# Patient Record
Sex: Male | Born: 1960 | Race: Black or African American | Hispanic: No | Marital: Married | Smoking: Never smoker
Health system: Southern US, Community
[De-identification: ages and names within clinical notes are randomized; demographics above are authoritative.]

## PROBLEM LIST (undated history)

## (undated) DIAGNOSIS — C61 Malignant neoplasm of prostate: Secondary | ICD-10-CM

## (undated) DIAGNOSIS — R569 Unspecified convulsions: Secondary | ICD-10-CM

## (undated) HISTORY — PX: HERNIA REPAIR: SHX51

---

## 2004-01-01 ENCOUNTER — Emergency Department (HOSPITAL_COMMUNITY): Admission: EM | Admit: 2004-01-01 | Discharge: 2004-01-01 | Payer: Self-pay | Admitting: Emergency Medicine

## 2007-06-18 ENCOUNTER — Emergency Department (HOSPITAL_COMMUNITY): Admission: EM | Admit: 2007-06-18 | Discharge: 2007-06-18 | Payer: Self-pay | Admitting: Emergency Medicine

## 2015-06-01 ENCOUNTER — Emergency Department (HOSPITAL_COMMUNITY)
Admission: EM | Admit: 2015-06-01 | Discharge: 2015-06-01 | Disposition: A | Discharge status: Home / Self Care | Payer: Self-pay | Attending: Emergency Medicine | Admitting: Emergency Medicine

## 2015-06-01 ENCOUNTER — Encounter (HOSPITAL_COMMUNITY): Payer: Self-pay | Admitting: Emergency Medicine

## 2015-06-01 DIAGNOSIS — S3994XA Unspecified injury of external genitals, initial encounter: Secondary | ICD-10-CM | POA: Insufficient documentation

## 2015-06-01 DIAGNOSIS — X58XXXA Exposure to other specified factors, initial encounter: Secondary | ICD-10-CM | POA: Insufficient documentation

## 2015-06-01 DIAGNOSIS — Y998 Other external cause status: Secondary | ICD-10-CM | POA: Insufficient documentation

## 2015-06-01 DIAGNOSIS — Y9289 Other specified places as the place of occurrence of the external cause: Secondary | ICD-10-CM | POA: Insufficient documentation

## 2015-06-01 DIAGNOSIS — Y9389 Activity, other specified: Secondary | ICD-10-CM | POA: Insufficient documentation

## 2015-06-01 HISTORY — DX: Unspecified convulsions: R56.9

## 2015-06-01 NOTE — ED Provider Notes (Signed)
CSN: [id number]     Arrival date & time 06/01/15  0537 History   First MD Initiated Contact with Patient 06/01/15 [phone]     Chief Complaint  Patient presents with  . Penis Pain     (Consider location/radiation/quality/duration/timing/severity/associated sxs/prior Treatment) HPI  This is a 54 year old male who was having intercourse with his wife yesterday morning when he felt a pop. His penis has subsequently become swollen. There is little to no associated pain. He has not attempted intercourse since. He denies hematuria or difficulty urinating. There is no testicular involvement.  Past Medical History  Diagnosis Date  . Seizures    Past Surgical History  Procedure Laterality Date  . Hernia repair     No family history on file. Social History  Substance Use Topics  . Smoking status: Never Smoker   . Smokeless tobacco: None  . Alcohol Use: Yes     Comment: social     Review of Systems  All other systems reviewed and are negative.   Allergies  Review of patient's allergies indicates no known allergies.  Home Medications   Prior to Admission medications   Not on File   BP 160/100 mmHg  Pulse 79  Temp(Src) 98.2 F (36.8 C) (Oral)  Resp 18  Wt 190 lb (86.183 kg)  SpO2 97%   Physical Exam  General: Well-developed, well-nourished male in no acute distress; appearance consistent with age of record HENT: normocephalic; atraumatic Eyes: pupils equal, round and reactive to light; extraocular muscles intact; arcus senilis bilaterally Neck: supple Heart: regular rate and rhythm; no murmurs, rubs or gallops Lungs: clear to auscultation bilaterally Abdomen: soft; nondistended; nontender; no masses or hepatosplenomegaly; bowel sounds present GU: Tanner 5 male, circumcised; edema of the shaft of the penis without tenderness; testicles descended bilaterally without mass or tenderness Extremities: No deformity; full range of motion; pulses normal Neurologic: Awake, alert and  oriented; motor function intact in all extremities and symmetric; no facial droop Skin: Warm and dry Psychiatric: Normal mood and affect    ED Course  Procedures (including critical care time)   MDM  6:06 AM Discussed with Dr. Matilde Sprang, who will see patient in the office this morning.   Shanon Rosser, MD 06/01/15 [phone]

## 2015-06-01 NOTE — ED Notes (Addendum)
Pt from home c/o penile pain after having intercourse on Sunday morning around 0800. He reports during the activity he heard a pop, swelling to his penis present, and the "vein" he normally sees is now gone. Pt is concerned and would like to be examined. He reports pain 1/10. He denies urinary symptoms.

## 2015-06-01 NOTE — ED Notes (Signed)
D/c instructions and consult information reviewed with pt. Pt voiced understanding and had no questions. Departed under own power and in NAD.

## 2015-12-07 ENCOUNTER — Encounter (HOSPITAL_COMMUNITY): Payer: Self-pay | Admitting: Emergency Medicine

## 2015-12-07 ENCOUNTER — Emergency Department (HOSPITAL_COMMUNITY)
Admission: EM | Admit: 2015-12-07 | Discharge: 2015-12-07 | Disposition: A | Discharge status: Home / Self Care | Payer: Self-pay | Attending: Emergency Medicine | Admitting: Emergency Medicine

## 2015-12-07 ENCOUNTER — Emergency Department (HOSPITAL_COMMUNITY): Payer: Self-pay

## 2015-12-07 DIAGNOSIS — Y998 Other external cause status: Secondary | ICD-10-CM | POA: Insufficient documentation

## 2015-12-07 DIAGNOSIS — R05 Cough: Secondary | ICD-10-CM | POA: Insufficient documentation

## 2015-12-07 DIAGNOSIS — Y249XXA Unspecified firearm discharge, undetermined intent, initial encounter: Secondary | ICD-10-CM

## 2015-12-07 DIAGNOSIS — Y92009 Unspecified place in unspecified non-institutional (private) residence as the place of occurrence of the external cause: Secondary | ICD-10-CM | POA: Insufficient documentation

## 2015-12-07 DIAGNOSIS — Z8719 Personal history of other diseases of the digestive system: Secondary | ICD-10-CM | POA: Insufficient documentation

## 2015-12-07 DIAGNOSIS — R0981 Nasal congestion: Secondary | ICD-10-CM | POA: Insufficient documentation

## 2015-12-07 DIAGNOSIS — Z23 Encounter for immunization: Secondary | ICD-10-CM | POA: Insufficient documentation

## 2015-12-07 DIAGNOSIS — S81002A Unspecified open wound, left knee, initial encounter: Secondary | ICD-10-CM | POA: Insufficient documentation

## 2015-12-07 DIAGNOSIS — Z880 Allergy status to penicillin: Secondary | ICD-10-CM | POA: Insufficient documentation

## 2015-12-07 DIAGNOSIS — Y9389 Activity, other specified: Secondary | ICD-10-CM | POA: Insufficient documentation

## 2015-12-07 DIAGNOSIS — W3400XA Accidental discharge from unspecified firearms or gun, initial encounter: Secondary | ICD-10-CM | POA: Insufficient documentation

## 2015-12-07 MED ORDER — TETANUS-DIPHTH-ACELL PERTUSSIS 5-2.5-18.5 LF-MCG/0.5 IM SUSP
0.5000 mL | Freq: Once | INTRAMUSCULAR | Status: AC
  Administered 2015-12-07: 0.5 mL via INTRAMUSCULAR
  Filled 2015-12-07: qty 0.5
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
## 2015-12-07 NOTE — Progress Notes (Signed)
Orthopedic Tech Progress Note Patient Details:  Roberto Watson 01-26-1961 [id number]  Patient ID: Roberto Watson, male   DOB: 02/01/1961, 55 y.o.   MRN: [id number] Pt was later downgraded to a non trauma  Roberto Watson 12/07/2015, 1:54 PM

## 2015-12-08 ENCOUNTER — Encounter (HOSPITAL_COMMUNITY): Payer: Self-pay | Admitting: Emergency Medicine

## 2016-10-03 ENCOUNTER — Encounter: Payer: Self-pay | Admitting: Physician Assistant

## 2016-10-03 ENCOUNTER — Ambulatory Visit (INDEPENDENT_AMBULATORY_CARE_PROVIDER_SITE_OTHER): Payer: BLUE CROSS/BLUE SHIELD | Admitting: Physician Assistant

## 2016-10-03 VITALS — BP 132/88 | HR 72 | Temp 99.0°F | Resp 16 | Ht 69.0 in | Wt 184.2 lb

## 2016-10-03 DIAGNOSIS — R0981 Nasal congestion: Secondary | ICD-10-CM

## 2016-10-03 DIAGNOSIS — R05 Cough: Secondary | ICD-10-CM | POA: Diagnosis not present

## 2016-10-03 DIAGNOSIS — J329 Chronic sinusitis, unspecified: Secondary | ICD-10-CM | POA: Diagnosis not present

## 2016-10-03 DIAGNOSIS — R059 Cough, unspecified: Secondary | ICD-10-CM

## 2016-10-03 MED ORDER — FLUTICASONE PROPIONATE 50 MCG/ACT NA SUSP: 2.0000 | Freq: Every day | NASAL | 6 refills | Status: DC

## 2016-10-03 MED ORDER — MUCINEX DM MAXIMUM STRENGTH 60-1200 MG PO TB12: 1.0000 | ORAL_TABLET | Freq: Two times a day (BID) | ORAL | 1 refills | Status: DC

## 2016-10-03 MED ORDER — DOXYCYCLINE HYCLATE 100 MG PO CAPS: 100.0000 mg | ORAL_CAPSULE | Freq: Two times a day (BID) | ORAL | 0 refills | Status: AC

## 2016-10-03 NOTE — Progress Notes (Signed)
   Roberto Watson  MRN: [id number] DOB: Jun 03, 1961  PCP: No primary care provider on file.  Subjective:  Pt is a 56 year old male who presents to clinic for nasal congestion x 2-3 months. He gets more stopped up at night, makes it difficult to sleep. Blowing his nose all day. Reports some blood with nose blowing. Coughs some, nothing too bad. Cough is productive.  +headache, sinus pressure.  He has tried claritin D, sudafed - helped some, then came right back. He is not drinking much water at all. Denies fever, chills, body aches, sweats, chest pain, SOB palpitations, headache, ear pain, decreased appetite.  History of seasonal allergies. Does not take anything. No known sick contacts  Review of Systems  Constitutional: Negative for chills, diaphoresis and fever.  HENT: Positive for congestion, postnasal drip, rhinorrhea and sinus pressure. Negative for sinus pain, sneezing and sore throat.   Respiratory: Positive for cough. Negative for chest tightness, shortness of breath and wheezing.   Cardiovascular: Negative for chest pain and palpitations.  Gastrointestinal: Negative for diarrhea, nausea and vomiting.  Neurological: Positive for headaches. Negative for dizziness, syncope and light-headedness.  Psychiatric/Behavioral: Negative for sleep disturbance.    There are no active problems to display for this patient.   No current outpatient prescriptions on file prior to visit.   No current facility-administered medications on file prior to visit.     Allergies  Allergen Reactions  . Penicillins Anaphylaxis  .           Objective:  BP 132/88 (BP Location: Left Arm, Cuff Size: Normal)   Pulse 72   Temp 99 F (37.2 C) (Oral)   Resp 16   Ht 5\' 9"  (1.753 m)   Wt 184 lb 3.2 oz (83.6 kg)   SpO2 99%   BMI 27.20 kg/m   Physical Exam  Constitutional: He is oriented to person, place, and time and well-developed, well-nourished, and in no distress. No distress.  HENT:  Right  Ear: Tympanic membrane normal.  Left Ear: Tympanic membrane normal.  Nose: Mucosal edema and rhinorrhea present. Right sinus exhibits maxillary sinus tenderness. Right sinus exhibits no frontal sinus tenderness. Left sinus exhibits maxillary sinus tenderness. Left sinus exhibits no frontal sinus tenderness.  Mouth/Throat: Mucous membranes are normal. Posterior oropharyngeal edema present. No oropharyngeal exudate or posterior oropharyngeal erythema.  Cardiovascular: Normal rate, regular rhythm and normal heart sounds.   Pulmonary/Chest: Effort normal and breath sounds normal.  Neurological: He is alert and oriented to person, place, and time. GCS score is 15.  Skin: Skin is warm and dry.  Psychiatric: Mood, memory, affect and judgment normal.  Vitals reviewed.   Assessment and Plan :  1. Sinusitis, unspecified location 2. Nasal congestion 3. Cough - doxycycline (VIBRAMYCIN) 100 MG capsule; Take 1 capsule (100 mg total) by mouth 2 (two) times daily.  Dispense: 20 capsule; Refill: 0 - fluticasone (FLONASE) 50 MCG/ACT nasal spray; Place 2 sprays into both nostrils daily.  Dispense: 16 g; Refill: 6 - Dextromethorphan-Guaifenesin (MUCINEX DM MAXIMUM STRENGTH) 60-1200 MG TB12; Take 1 tablet by mouth every 12 (twelve) hours.  Dispense: 14 each; Refill: 1 - Supportive care: Push fluids, rest. RTC in one week if no improvement.    Mercer Pod, PA-C  [facility] 10/03/2016 11:27 AM

## 2016-10-03 NOTE — Patient Instructions (Addendum)
Please drink at least 2 liters of water a day. Mucinex- take this as directed.  Flonase: 2 sprays each nostril before bed at night and in the morning. See package insert on how to spray.  Doxycycline - Use this if you are not better in 5 days.   Come back to the clinic if you are not better after your treatment.   Thank you for coming in today. I hope you feel we met your needs.  Feel free to call [facility] if you have any questions or further requests.  Please consider signing up for MyChart if you do not already have it, as this is a great way to communicate with me.  Best,  Whitney McVey, PA-C  IF you received an x-ray today, you will receive an invoice from [facility]. Please contact [facility] at [phone] with questions or concerns regarding your invoice.   IF you received labwork today, you will receive an invoice from [facility]. Please contact [facility] at [phone] with questions or concerns regarding your invoice.   Our billing staff will not be able to assist you with questions regarding bills from these companies.  You will be contacted with the lab results as soon as they are available. The fastest way to get your results is to activate your My Chart account. Instructions are located on the last page of this paperwork. If you have not heard from [place] regarding the results in 2 weeks, please contact this office.

## 2018-01-26 ENCOUNTER — Emergency Department (HOSPITAL_COMMUNITY): Payer: No Typology Code available for payment source

## 2018-01-26 ENCOUNTER — Encounter (HOSPITAL_COMMUNITY): Payer: Self-pay

## 2018-01-26 ENCOUNTER — Other Ambulatory Visit: Payer: Self-pay

## 2018-01-26 ENCOUNTER — Emergency Department (HOSPITAL_COMMUNITY)
Admission: EM | Admit: 2018-01-26 | Discharge: 2018-01-26 | Disposition: A | Discharge status: Home / Self Care | Payer: No Typology Code available for payment source | Attending: Emergency Medicine | Admitting: Emergency Medicine

## 2018-01-26 DIAGNOSIS — Y9389 Activity, other specified: Secondary | ICD-10-CM | POA: Insufficient documentation

## 2018-01-26 DIAGNOSIS — S2242XA Multiple fractures of ribs, left side, initial encounter for closed fracture: Secondary | ICD-10-CM

## 2018-01-26 DIAGNOSIS — Z79899 Other long term (current) drug therapy: Secondary | ICD-10-CM | POA: Insufficient documentation

## 2018-01-26 DIAGNOSIS — R1012 Left upper quadrant pain: Secondary | ICD-10-CM | POA: Diagnosis not present

## 2018-01-26 DIAGNOSIS — R42 Dizziness and giddiness: Secondary | ICD-10-CM | POA: Diagnosis present

## 2018-01-26 DIAGNOSIS — Y929 Unspecified place or not applicable: Secondary | ICD-10-CM | POA: Diagnosis not present

## 2018-01-26 DIAGNOSIS — Y999 Unspecified external cause status: Secondary | ICD-10-CM | POA: Diagnosis not present

## 2018-01-26 LAB — COMPREHENSIVE METABOLIC PANEL
ALBUMIN: 4.2 g/dL (ref 3.5–5.0)
ALT: 26 U/L (ref 17–63)
ANION GAP: 10 (ref 5–15)
AST: 29 U/L (ref 15–41)
Alkaline Phosphatase: 83 U/L (ref 38–126)
BUN: 12 mg/dL (ref 6–20)
CO2: 26 mmol/L (ref 22–32)
Calcium: 9 mg/dL (ref 8.9–10.3)
Chloride: 102 mmol/L (ref 101–111)
Creatinine, Ser: 1.19 mg/dL (ref 0.61–1.24)
GFR calc Af Amer: >60 mL/min (ref >60)
GFR calc non Af Amer: >60 mL/min (ref >60)
GLUCOSE: 109 mg/dL — AB (ref 65–99)
POTASSIUM: 3.4 mmol/L — AB (ref 3.5–5.1)
SODIUM: 138 mmol/L (ref 135–145)
Total Bilirubin: 1.3 mg/dL — ABNORMAL HIGH (ref 0.3–1.2)
Total Protein: 7.3 g/dL (ref 6.5–8.1)

## 2018-01-26 LAB — CBC WITH DIFFERENTIAL/PLATELET
Abs Immature Granulocytes: 0.1 10*3/uL (ref 0.0–0.1)
BASOS ABS: 0 10*3/uL (ref 0.0–0.1)
Basophils Relative: 0 %
EOS ABS: 0 10*3/uL (ref 0.0–0.7)
Eosinophils Relative: 0 %
HCT: 46.8 % (ref 39.0–52.0)
HEMOGLOBIN: 15.9 g/dL (ref 13.0–17.0)
IMMATURE GRANULOCYTES: 1 %
Lymphocytes Relative: 9 %
Lymphs Abs: 0.9 10*3/uL (ref 0.7–4.0)
MCH: 32.3 pg (ref 26.0–34.0)
MCHC: 34 g/dL (ref 30.0–36.0)
MCV: 95.1 fL (ref 78.0–100.0)
Monocytes Absolute: 0.8 10*3/uL (ref 0.1–1.0)
Monocytes Relative: 8 %
Neutro Abs: 8 10*3/uL — ABNORMAL HIGH (ref 1.7–7.7)
Neutrophils Relative %: 82 %
Platelets: 206 10*3/uL (ref 150–400)
RBC: 4.92 MIL/uL (ref 4.22–5.81)
RDW: 13.2 % (ref 11.5–15.5)
WBC: 9.8 10*3/uL (ref 4.0–10.5)

## 2018-01-26 MED ORDER — HYDROMORPHONE HCL 2 MG/ML IJ SOLN
1.0000 mg | Freq: Once | INTRAMUSCULAR | Status: AC
  Administered 2018-01-26: 1 mg via INTRAVENOUS
  Filled 2018-01-26: qty 1

## 2018-01-26 MED ORDER — IOHEXOL 300 MG/ML  SOLN
100.0000 mL | Freq: Once | INTRAMUSCULAR | Status: AC | PRN
  Administered 2018-01-26: 100 mL via INTRAVENOUS

## 2018-01-26 MED ORDER — SODIUM CHLORIDE 0.9 % IV BOLUS
500.0000 mL | Freq: Once | INTRAVENOUS | Status: AC
  Administered 2018-01-26: 500 mL via INTRAVENOUS

## 2018-01-26 MED ORDER — OXYCODONE-ACETAMINOPHEN 5-325 MG PO TABS: 1.0000 | ORAL_TABLET | ORAL | 0 refills | Status: DC | PRN

## 2018-01-26 MED ORDER — ONDANSETRON HCL 4 MG/2ML IJ SOLN
4.0000 mg | Freq: Once | INTRAMUSCULAR | Status: AC
  Administered 2018-01-26: 4 mg via INTRAVENOUS
  Filled 2018-01-26: qty 2
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
## 2018-01-26 NOTE — ED Notes (Signed)
Patient transported to X-ray 
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
## 2018-01-27 ENCOUNTER — Emergency Department (HOSPITAL_COMMUNITY)
Admission: EM | Admit: 2018-01-27 | Discharge: 2018-01-27 | Disposition: A | Discharge status: Home / Self Care | Payer: No Typology Code available for payment source | Attending: Emergency Medicine | Admitting: Emergency Medicine

## 2018-01-27 ENCOUNTER — Emergency Department (HOSPITAL_COMMUNITY): Payer: No Typology Code available for payment source

## 2018-01-27 ENCOUNTER — Other Ambulatory Visit: Payer: Self-pay

## 2018-01-27 ENCOUNTER — Encounter (HOSPITAL_COMMUNITY): Payer: Self-pay

## 2018-01-27 DIAGNOSIS — Z79899 Other long term (current) drug therapy: Secondary | ICD-10-CM | POA: Diagnosis not present

## 2018-01-27 DIAGNOSIS — R42 Dizziness and giddiness: Secondary | ICD-10-CM

## 2018-01-27 DIAGNOSIS — R112 Nausea with vomiting, unspecified: Secondary | ICD-10-CM | POA: Diagnosis not present

## 2018-01-27 LAB — COMPREHENSIVE METABOLIC PANEL
ALBUMIN: 3.9 g/dL (ref 3.5–5.0)
ALK PHOS: 74 U/L (ref 38–126)
ALT: 24 U/L (ref 17–63)
AST: 23 U/L (ref 15–41)
Anion gap: 11 (ref 5–15)
BUN: 20 mg/dL (ref 6–20)
CALCIUM: 8.5 mg/dL — AB (ref 8.9–10.3)
CO2: 26 mmol/L (ref 22–32)
CREATININE: 1.07 mg/dL (ref 0.61–1.24)
Chloride: 101 mmol/L (ref 101–111)
GFR calc Af Amer: >60 mL/min (ref >60)
GFR calc non Af Amer: >60 mL/min (ref >60)
GLUCOSE: 113 mg/dL — AB (ref 65–99)
Potassium: 3.8 mmol/L (ref 3.5–5.1)
SODIUM: 138 mmol/L (ref 135–145)
Total Bilirubin: 1.4 mg/dL — ABNORMAL HIGH (ref 0.3–1.2)
Total Protein: 7.4 g/dL (ref 6.5–8.1)

## 2018-01-27 LAB — CBC
HCT: 42.2 % (ref 39.0–52.0)
Hemoglobin: 14.6 g/dL (ref 13.0–17.0)
MCH: 33.8 pg (ref 26.0–34.0)
MCHC: 34.6 g/dL (ref 30.0–36.0)
MCV: 97.7 fL (ref 78.0–100.0)
PLATELETS: 180 10*3/uL (ref 150–400)
RBC: 4.32 MIL/uL (ref 4.22–5.81)
RDW: 13.9 % (ref 11.5–15.5)
WBC: 6.9 10*3/uL (ref 4.0–10.5)

## 2018-01-27 LAB — LIPASE, BLOOD: LIPASE: 24 U/L (ref 11–51)

## 2018-01-27 MED ORDER — LORAZEPAM 2 MG/ML IJ SOLN
0.5000 mg | Freq: Once | INTRAMUSCULAR | Status: AC
  Administered 2018-01-27: 0.5 mg via INTRAVENOUS
  Filled 2018-01-27: qty 1

## 2018-01-27 MED ORDER — ONDANSETRON HCL 4 MG/2ML IJ SOLN
4.0000 mg | Freq: Once | INTRAMUSCULAR | Status: AC
  Administered 2018-01-27: 4 mg via INTRAVENOUS
  Filled 2018-01-27: qty 2

## 2018-01-27 MED ORDER — IOPAMIDOL (ISOVUE-370) INJECTION 76%
100.0000 mL | Freq: Once | INTRAVENOUS | Status: AC | PRN
  Administered 2018-01-27: 80 mL via INTRAVENOUS

## 2018-01-27 MED ORDER — IOPAMIDOL (ISOVUE-370) INJECTION 76%
INTRAVENOUS | Status: AC
  Filled 2018-01-27: qty 100

## 2018-01-27 MED ORDER — MECLIZINE HCL 12.5 MG PO TABS: 12.5000 mg | ORAL_TABLET | Freq: Three times a day (TID) | ORAL | 0 refills | Status: DC | PRN

## 2018-01-27 MED ORDER — SODIUM CHLORIDE 0.9 % IV BOLUS
1000.0000 mL | Freq: Once | INTRAVENOUS | Status: AC
  Administered 2018-01-27: 1000 mL via INTRAVENOUS
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
## 2018-01-27 NOTE — Discharge Instructions (Addendum)
It was our pleasure to provide your ER care today - we hope that you feel better.  Sinus congestion noted on imaging studies which may contribute to your symptoms - take claritin-d or zyrtec-d as need for congestion and symptom relief. You may also take antivert as need for symptom relief. No driving if/when dizzy.    A few incidental findings were noted on your imaging studies:  Chronic right maxillary sinusitis with medial wall bowing, possible obstructing polyp. Recommend ENT referral. At the same time, a 2.1 cm thyroid nodule can be worked up with ultrasound. Large polyp in the right maxillary sinus extending into the nasal cavity, suspicious for inverted papilloma.  Atherosclerosis in carotid/basilar vessels.  Scattered small nonspecific hypodensities within the liver measure up to 1.4 cm in size.  For sinus polyp - follow up with ENT doctor in the next few weeks - call office Monday to arrange appointment.  For thyroid nodule, atherosclerosis, and liver densities - follow up with primary care doctor in the next 1-2 weeks - discuss with them and have them arrange an ultrasound and further evaluation. Also have blood pressure rechecked by primary care doctor as it is high today.   Return to ER if worse, new symptoms, new or severe pain, fevers, fainting, other concern.

## 2018-01-27 NOTE — ED Notes (Signed)
Bed: PY19 Expected date:  Expected time:  Means of arrival:  Comments: 57 yo N/V

## 2018-01-27 NOTE — ED Notes (Signed)
ED Provider at bedside. 

## 2018-01-27 NOTE — ED Notes (Signed)
Patient transported to MRI 

## 2019-02-21 ENCOUNTER — Other Ambulatory Visit: Payer: Self-pay

## 2019-02-21 ENCOUNTER — Emergency Department (HOSPITAL_COMMUNITY): Payer: Self-pay

## 2019-02-21 ENCOUNTER — Encounter (HOSPITAL_COMMUNITY): Payer: Self-pay

## 2019-02-21 ENCOUNTER — Emergency Department (HOSPITAL_COMMUNITY)
Admission: EM | Admit: 2019-02-21 | Discharge: 2019-02-21 | Disposition: A | Discharge status: Home / Self Care | Payer: Self-pay | Attending: Emergency Medicine | Admitting: Emergency Medicine

## 2019-02-21 DIAGNOSIS — R569 Unspecified convulsions: Secondary | ICD-10-CM | POA: Insufficient documentation

## 2019-02-21 DIAGNOSIS — Z79899 Other long term (current) drug therapy: Secondary | ICD-10-CM | POA: Insufficient documentation

## 2019-02-21 DIAGNOSIS — R42 Dizziness and giddiness: Secondary | ICD-10-CM | POA: Insufficient documentation

## 2019-02-21 LAB — URINALYSIS, ROUTINE W REFLEX MICROSCOPIC
Bilirubin Urine: NEGATIVE
Glucose, UA: NEGATIVE mg/dL
Hgb urine dipstick: NEGATIVE
Ketones, ur: NEGATIVE mg/dL
Leukocytes,Ua: NEGATIVE
Nitrite: NEGATIVE
Protein, ur: NEGATIVE mg/dL
Specific Gravity, Urine: 1.02 (ref 1.005–1.030)
pH: 5 (ref 5.0–8.0)

## 2019-02-21 LAB — CBG MONITORING, ED: Glucose-Capillary: 83 mg/dL (ref 70–99)

## 2019-02-21 LAB — CBC
HCT: 46.1 % (ref 39.0–52.0)
Hemoglobin: 15 g/dL (ref 13.0–17.0)
MCH: 32.7 pg (ref 26.0–34.0)
MCHC: 32.5 g/dL (ref 30.0–36.0)
MCV: 100.4 fL — ABNORMAL HIGH (ref 80.0–100.0)
Platelets: 184 10*3/uL (ref 150–400)
RBC: 4.59 MIL/uL (ref 4.22–5.81)
RDW: 13.5 % (ref 11.5–15.5)
WBC: 4 10*3/uL (ref 4.0–10.5)
nRBC: 0 % (ref 0.0–0.2)

## 2019-02-21 LAB — BASIC METABOLIC PANEL
Anion gap: 10 (ref 5–15)
BUN: 15 mg/dL (ref 6–20)
CO2: 26 mmol/L (ref 22–32)
Calcium: 8.8 mg/dL — ABNORMAL LOW (ref 8.9–10.3)
Chloride: 102 mmol/L (ref 98–111)
Creatinine, Ser: 1.01 mg/dL (ref 0.61–1.24)
GFR calc Af Amer: >60 mL/min (ref >60)
GFR calc non Af Amer: >60 mL/min (ref >60)
Glucose, Bld: 97 mg/dL (ref 70–99)
Potassium: 3.5 mmol/L (ref 3.5–5.1)
Sodium: 138 mmol/L (ref 135–145)

## 2019-02-21 MED ORDER — MECLIZINE HCL 25 MG PO TABS
25.0000 mg | ORAL_TABLET | Freq: Once | ORAL | Status: AC
Start: 2019-02-21 — End: 2019-02-21
  Administered 2019-02-21: 25 mg via ORAL
  Filled 2019-02-21: qty 1

## 2019-02-21 MED ORDER — SODIUM CHLORIDE 0.9 % IV BOLUS
1000.0000 mL | Freq: Once | INTRAVENOUS | Status: AC
  Administered 2019-02-21: 1000 mL via INTRAVENOUS

## 2019-02-21 MED ORDER — SODIUM CHLORIDE 0.9% FLUSH
3.0000 mL | Freq: Once | INTRAVENOUS | Status: AC
  Administered 2019-02-21: 3 mL via INTRAVENOUS

## 2019-02-21 MED ORDER — MECLIZINE HCL 25 MG PO TABS: 25.0000 mg | ORAL_TABLET | Freq: Three times a day (TID) | ORAL | 0 refills | Status: DC | PRN
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
## 2019-02-21 NOTE — Discharge Instructions (Signed)
Stay hydrated.   Avoid standing up suddenly or turning your head quickly   Take meclizine as needed for dizziness   See your doctor  Return to ER if you have worse dizziness, passing out, chest pain

## 2019-02-21 NOTE — ED Notes (Signed)
Bed: WLPT1 Expected date:  Expected time:  Means of arrival:  Comments: 
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
## 2019-08-26 ENCOUNTER — Other Ambulatory Visit: Payer: Self-pay

## 2019-08-26 ENCOUNTER — Ambulatory Visit: Payer: Self-pay | Admitting: Emergency Medicine

## 2019-08-26 ENCOUNTER — Emergency Department (HOSPITAL_COMMUNITY)
Admission: EM | Admit: 2019-08-26 | Discharge: 2019-08-26 | Disposition: A | Discharge status: Home / Self Care | Payer: BC Managed Care – PPO | Attending: Emergency Medicine | Admitting: Emergency Medicine

## 2019-08-26 ENCOUNTER — Emergency Department (HOSPITAL_COMMUNITY): Payer: BC Managed Care – PPO

## 2019-08-26 ENCOUNTER — Encounter (HOSPITAL_COMMUNITY): Payer: Self-pay | Admitting: Emergency Medicine

## 2019-08-26 DIAGNOSIS — Z79899 Other long term (current) drug therapy: Secondary | ICD-10-CM | POA: Insufficient documentation

## 2019-08-26 DIAGNOSIS — R531 Weakness: Secondary | ICD-10-CM | POA: Insufficient documentation

## 2019-08-26 DIAGNOSIS — R29898 Other symptoms and signs involving the musculoskeletal system: Secondary | ICD-10-CM

## 2019-08-26 LAB — BASIC METABOLIC PANEL
Anion gap: 10 (ref 5–15)
BUN: 21 mg/dL — ABNORMAL HIGH (ref 6–20)
CO2: 27 mmol/L (ref 22–32)
Calcium: 9.2 mg/dL (ref 8.9–10.3)
Chloride: 104 mmol/L (ref 98–111)
Creatinine, Ser: 1.03 mg/dL (ref 0.61–1.24)
GFR calc Af Amer: >60 mL/min (ref >60)
GFR calc non Af Amer: >60 mL/min (ref >60)
Glucose, Bld: 93 mg/dL (ref 70–99)
Potassium: 3.8 mmol/L (ref 3.5–5.1)
Sodium: 141 mmol/L (ref 135–145)

## 2019-08-26 LAB — CBC
HCT: 48.9 % (ref 39.0–52.0)
Hemoglobin: 16.4 g/dL (ref 13.0–17.0)
MCH: 33.5 pg (ref 26.0–34.0)
MCHC: 33.5 g/dL (ref 30.0–36.0)
MCV: 99.8 fL (ref 80.0–100.0)
Platelets: 194 10*3/uL (ref 150–400)
RBC: 4.9 MIL/uL (ref 4.22–5.81)
RDW: 13.4 % (ref 11.5–15.5)
WBC: 4.6 10*3/uL (ref 4.0–10.5)
nRBC: 0 % (ref 0.0–0.2)

## 2019-08-26 LAB — CBG MONITORING, ED: Glucose-Capillary: 79 mg/dL (ref 70–99)

## 2019-08-26 MED ORDER — SODIUM CHLORIDE 0.9% FLUSH: 3.0000 mL | Freq: Once | INTRAVENOUS | Status: DC

## 2019-08-26 NOTE — ED Triage Notes (Signed)
Pt complaint of right leg "giving out" onset 0700 Saturday. Pt also verbalizing "on and off dizziness." Pt denies numbness. Pt extremities strong/equal. A/Ox4. Denies dizziness at present.

## 2019-08-26 NOTE — Discharge Instructions (Addendum)
Return for any problem.  Follow-up with your regular doctor as instructed. °

## 2019-08-26 NOTE — ED Provider Notes (Signed)
[place] DEPT Provider Note   CSN: [id number] Arrival date & time: 08/26/19  1303     History Chief Complaint  Patient presents with  . Weakness    Roberto Watson is a 58 y.o. male.  58 year old male with prior medical history as detailed below presents for evaluation of right leg weakness.  Patient reports that 2 days ago his right leg felt weak.  He felt like his right leg would give out on him.  He was still able to ambulate.  He also reports that the right leg would intermittently " twitch" over the last 2 days as well.  He is currently asymptomatic.  He reports that his symptoms have completely resolved.  He denies associated nausea, vomiting, chest pain, shortness of breath, visual change, other weakness, or other specific complaint.  Overall symptoms lasted approximately 12 to 24 hours.  The history is provided by the patient and medical records.  Weakness Severity:  Mild Onset quality:  Gradual Duration:  2 days Timing:  Rare Progression:  Resolved Chronicity:  New Relieved by:  Nothing Worsened by:  Nothing Associated symptoms: no fever, no headaches, no shortness of breath and no vision change        Past Medical History:  Diagnosis Date  . Seizures ([place])     There are no problems to display for this patient.   Past Surgical History:  Procedure Laterality Date  . HERNIA REPAIR         No family history on file.  Social History   Tobacco Use  . Smoking status: Never Smoker  . Smokeless tobacco: Never Used  Substance Use Topics  . Alcohol use: No    Comment: social   . Drug use: No    Home Medications Prior to Admission medications   Medication Sig Start Date End Date Taking? Authorizing Provider  Dextromethorphan-Guaifenesin (MUCINEX DM MAXIMUM STRENGTH) 60-1200 MG TB12 Take 1 tablet by mouth every 12 (twelve) hours. Patient not taking: Reported on 01/27/2018 10/03/16   McVey, Gelene Mink, PA-C    fluticasone [facility]) 50 MCG/ACT nasal spray Place 2 sprays into both nostrils daily. Patient not taking: Reported on 01/27/2018 10/03/16   McVey, Gelene Mink, PA-C  meclizine (ANTIVERT) 25 MG tablet Take 1 tablet (25 mg total) by mouth 3 (three) times daily as needed for dizziness. 02/21/19   Drenda Freeze, MD  oxyCODONE-acetaminophen (PERCOCET) 5-325 MG tablet Take 1-2 tablets by mouth every 4 (four) hours as needed. Patient not taking: Reported on 02/21/2019 01/26/18   Orpah Greek, MD    Allergies    Penicillins  Review of Systems   Review of Systems  Constitutional: Negative for fever.  Respiratory: Negative for shortness of breath.   Neurological: Positive for weakness. Negative for headaches.  All other systems reviewed and are negative.   Physical Exam Updated Vital Signs BP (!) 165/104 (BP Location: Left Arm)   Pulse 77   Temp 98.3 F (36.8 C) (Oral)   Resp 16   Ht 5\' 7"  (1.702 m)   Wt 81.6 kg   SpO2 98%   BMI 28.19 kg/m   Physical Exam Vitals and nursing note reviewed.  Constitutional:      General: He is not in acute distress.    Appearance: Normal appearance. He is well-developed.  HENT:     Head: Normocephalic and atraumatic.  Eyes:     Conjunctiva/sclera: Conjunctivae normal.     Pupils: Pupils are equal, round, and reactive  to light.  Cardiovascular:     Rate and Rhythm: Normal rate and regular rhythm.     Heart sounds: Normal heart sounds.  Pulmonary:     Effort: Pulmonary effort is normal. No respiratory distress.     Breath sounds: Normal breath sounds.  Abdominal:     General: There is no distension.     Palpations: Abdomen is soft.     Tenderness: There is no abdominal tenderness.  Musculoskeletal:        General: No deformity. Normal range of motion.     Cervical back: Normal range of motion and neck supple.  Skin:    General: Skin is warm and dry.  Neurological:     General: No focal deficit present.     Mental Status:  He is alert and oriented to person, place, and time. Mental status is at baseline.     Cranial Nerves: No cranial nerve deficit.     Sensory: No sensory deficit.     Motor: No weakness.     Coordination: Coordination normal.     Gait: Gait normal.     ED Results / Procedures / Treatments   Labs (all labs ordered are listed, but only abnormal results are displayed) Labs Reviewed  BASIC METABOLIC PANEL - Abnormal; Notable for the following components:      Result Value   BUN 21 (*)    All other components within normal limits  CBC  URINALYSIS, ROUTINE W REFLEX MICROSCOPIC  CBG MONITORING, ED    EKG EKG Interpretation  Date/Time:  Monday August 26 2019 13:16:29 EST Ventricular Rate:  77 PR Interval:    QRS Duration: 83 QT Interval:  351 QTC Calculation: 398 R Axis:   109 Text Interpretation: Sinus rhythm Right axis deviation Borderline repolarization abnormality Baseline wander in lead(s) V2 V3 Confirmed by Dene Gentry [phone]) on 08/26/2019 9:03:14 PM   Radiology CT Head Wo Contrast  Result Date: 08/26/2019 CLINICAL DATA:  Vertigo EXAM: CT HEAD WITHOUT CONTRAST TECHNIQUE: Contiguous axial images were obtained from the base of the skull through the vertex without intravenous contrast. COMPARISON:  02/21/2019 FINDINGS: Brain: Chronic small vessel disease throughout the deep white matter. Old bilateral basal ganglia lacunar infarcts. No acute intracranial abnormality. Specifically, no hemorrhage, hydrocephalus, mass lesion, acute infarction, or significant intracranial injury. Vascular: No hyperdense vessel or unexpected calcification. Skull: No acute calvarial abnormality. Sinuses/Orbits: Expansile process extending from the right maxillary sinus into the right nasal cavity, stable. Other: None IMPRESSION: Chronic small vessel disease. Old bilateral basal ganglia lacunar infarcts. No acute intracranial abnormality. Electronically Signed   By: Rolm Baptise M.D.   On: 08/26/2019  21:25    Procedures Procedures (including critical care time)  Medications Ordered in ED Medications  sodium chloride flush (NS) 0.9 % injection 3 mL (has no administration in time range)    ED Course  I have reviewed the triage vital signs and the nursing notes.  Pertinent labs & imaging results that were available during my care of the patient were reviewed by me and considered in my medical decision making (see chart for details).    MDM Rules/Calculators/A&P                      MDM  Screen complete  SUSAN HOOLE was evaluated in Emergency Department on 08/26/2019 for the symptoms described in the history of present illness. He was evaluated in the context of the global COVID-19 pandemic, which necessitated consideration that  the patient might be at risk for infection with the SARS-CoV-2 virus that causes COVID-19. Institutional protocols and algorithms that pertain to the evaluation of patients at risk for COVID-19 are in a state of rapid change based on information released by regulatory bodies including the CDC and federal and state organizations. These policies and algorithms were followed during the patient's care in the ED.   Patient is presenting for evaluation of right leg weakness.  This was transient.  His symptoms have completely resolved.  Presentation is not consistent with likely TIA or CVA.  However, patient did visit an urgent care prior to his evaluation here.  He is requesting CT of his head (as suggested by urgent care).  Screening work-up in the ED today does not demonstrate significant acute pathology.  Patient appears to be appropriate for discharge.  Importance of close follow-up is stressed.  Strict precautions given and understood.   Final Clinical Impression(s) / ED Diagnoses Final diagnoses:  Transient weakness of lower extremity    Rx / DC Orders ED Discharge Orders    None       Valarie Merino, MD 08/26/19 2149

## 2020-04-17 ENCOUNTER — Encounter (HOSPITAL_COMMUNITY): Payer: Self-pay | Admitting: Emergency Medicine

## 2020-04-17 ENCOUNTER — Emergency Department (HOSPITAL_COMMUNITY)
Admission: EM | Admit: 2020-04-17 | Discharge: 2020-04-17 | Disposition: A | Discharge status: Home / Self Care | Payer: BC Managed Care – PPO | Attending: Emergency Medicine | Admitting: Emergency Medicine

## 2020-04-17 ENCOUNTER — Other Ambulatory Visit: Payer: Self-pay

## 2020-04-17 DIAGNOSIS — M546 Pain in thoracic spine: Secondary | ICD-10-CM | POA: Diagnosis present

## 2020-04-17 MED ORDER — CYCLOBENZAPRINE HCL 10 MG PO TABS: 10.0000 mg | ORAL_TABLET | Freq: Two times a day (BID) | ORAL | 0 refills | Status: DC | PRN

## 2020-04-17 MED ORDER — IBUPROFEN 600 MG PO TABS: 600.0000 mg | ORAL_TABLET | Freq: Four times a day (QID) | ORAL | 0 refills | Status: DC | PRN

## 2020-04-17 NOTE — ED Provider Notes (Signed)
[facility] DEPT Provider Note   CSN: [id number] Arrival date & time: 04/17/20  0134     History Chief Complaint  Patient presents with  . Shoulder Pain    left    Roberto Watson is a 59 y.o. male who presents to the ED today with complaint of gradual onset, constant, achy, left upper back pain x 5-6 days. Pt reports he woke up with the pain; denies any trauma to the area. Denies any falls. Pt has been taking Tylenol without relief.  States that the pain is worse when he is attempting to sleep.  He tried to go to sleep last night and was unable to prompting him to come to the ED today.  Patient denies any numbness, weakness, tingling in his left upper extremity.   The history is provided by the patient and medical records.       Past Medical History:  Diagnosis Date  . Seizures ([place])     There are no problems to display for this patient.   Past Surgical History:  Procedure Laterality Date  . HERNIA REPAIR         No family history on file.  Social History   Tobacco Use  . Smoking status: Never Smoker  . Smokeless tobacco: Never Used  Vaping Use  . Vaping Use: Never used  Substance Use Topics  . Alcohol use: No    Comment: social   . Drug use: No    Home Medications Prior to Admission medications   Medication Sig Start Date End Date Taking? Authorizing Provider  cyclobenzaprine (FLEXERIL) 10 MG tablet Take 1 tablet (10 mg total) by mouth 2 (two) times daily as needed for muscle spasms. 04/17/20   Alroy Bailiff, Mckenzie Bove, PA-C  ibuprofen (ADVIL) 600 MG tablet Take 1 tablet (600 mg total) by mouth every 6 (six) hours as needed. 04/17/20   Eustaquio Maize, PA-C  meclizine (ANTIVERT) 25 MG tablet Take 1 tablet (25 mg total) by mouth 3 (three) times daily as needed for dizziness. 02/21/19   Drenda Freeze, MD    Allergies    Penicillins  Review of Systems   Review of Systems  Constitutional: Negative for chills and fever.    Musculoskeletal: Positive for back pain. Negative for arthralgias.  Neurological: Negative for weakness and numbness.    Physical Exam Updated Vital Signs BP (!) 157/121 (BP Location: Right Arm)   Pulse 94   Temp 98.2 F (36.8 C)   Resp 18   Ht 5\' 9"  (1.753 m)   Wt 83.9 kg   SpO2 99%   BMI 27.32 kg/m   Physical Exam Vitals and nursing note reviewed.  Constitutional:      Appearance: He is not ill-appearing.  HENT:     Head: Normocephalic and atraumatic.  Eyes:     Conjunctiva/sclera: Conjunctivae normal.  Cardiovascular:     Rate and Rhythm: Normal rate and regular rhythm.     Pulses: Normal pulses.  Pulmonary:     Effort: Pulmonary effort is normal.     Breath sounds: Normal breath sounds. No wheezing, rhonchi or rales.  Musculoskeletal:     Comments: No C, T, or L midline spinal TTP. + Left upper parathoracic musculature TTP with spasming. No specific tenderness to the bony aspect of the L shoulder. ROM intact to neck and back. Strength and sensation intact to BUEs. 2+ radial pulses bilaterally.   Skin:    General: Skin is warm and dry.  Coloration: Skin is not jaundiced.  Neurological:     Mental Status: He is alert.     ED Results / Procedures / Treatments   Labs (all labs ordered are listed, but only abnormal results are displayed) Labs Reviewed - No data to display  EKG None  Radiology No results found.  Procedures Procedures (including critical care time)  Medications Ordered in ED Medications - No data to display  ED Course  I have reviewed the triage vital signs and the nursing notes.  Pertinent labs & imaging results that were available during my care of the patient were reviewed by me and considered in my medical decision making (see chart for details).    MDM Rules/Calculators/A&P                          59 year old male who presents to the ED today complaining of atraumatic left upper back pain for the past 5 to 6 days, worsened at  nighttime when he is trying to sleep.  No other complaints at this time.  On arrival to the ED patient is afebrile, nontachycardic nontachypneic.  He appears to be in no acute distress.  He has no midline spinal tenderness.  He does have upper parathoracic musculature tenderness palpation with spasming.  Strength and sensation intact in bilateral upper extremities.  Do not feel patient needs any imaging at this time.  Will provide muscle relaxer and naproxen and have patient follow-up with his PCP.  Patient requesting work note for couple days, will oblige.  Return precautions have been discussed.  Patient is agreement with plan and stable for discharge home.    Final Clinical Impression(s) / ED Diagnoses Final diagnoses:  Acute left-sided thoracic back pain    Rx / DC Orders ED Discharge Orders         Ordered    cyclobenzaprine (FLEXERIL) 10 MG tablet  2 times daily PRN     Discontinue  Reprint     04/17/20 0715    ibuprofen (ADVIL) 600 MG tablet  Every 6 hours PRN     Discontinue  Reprint     04/17/20 0715           Discharge Instructions     Please pick up medication and take as prescribed. DO NOT DRIVE WHILE ON THE MUSCLE RELAXER AS IT CAN MAKE YOU DROWSY. Follow up with your PCP - if you do not have one there is a 1800 number on the last page of this discharge paperwork to help find one who accepts your insurance.   Return to the ED IMMEDIATELY for any worsening symptoms including worsening pain, weakness/numbness/tingling in your upper extremities, fevers > 100.4, or any other new/concerning symptoms       Eustaquio Maize, PA-C 04/17/20 1287    Varney Biles, MD 04/17/20 1045

## 2020-04-17 NOTE — ED Triage Notes (Signed)
Patient states he has been having shoulder pain for a week and while he was trying to sleep it was just unbearable Has been taking tylenol with no relief

## 2020-04-17 NOTE — Discharge Instructions (Signed)
Please pick up medication and take as prescribed. DO NOT DRIVE WHILE ON THE MUSCLE RELAXER AS IT CAN MAKE YOU DROWSY. Follow up with your PCP - if you do not have one there is a 1800 number on the last page of this discharge paperwork to help find one who accepts your insurance.   Return to the ED IMMEDIATELY for any worsening symptoms including worsening pain, weakness/numbness/tingling in your upper extremities, fevers > 100.4, or any other new/concerning symptoms

## 2020-12-05 ENCOUNTER — Encounter (HOSPITAL_COMMUNITY): Payer: Self-pay | Admitting: Emergency Medicine

## 2020-12-05 ENCOUNTER — Emergency Department (HOSPITAL_COMMUNITY)
Admission: EM | Admit: 2020-12-05 | Discharge: 2020-12-05 | Disposition: A | Discharge status: Home / Self Care | Payer: 59 | Attending: Emergency Medicine | Admitting: Emergency Medicine

## 2020-12-05 ENCOUNTER — Other Ambulatory Visit: Payer: Self-pay

## 2020-12-05 DIAGNOSIS — M79602 Pain in left arm: Secondary | ICD-10-CM | POA: Diagnosis not present

## 2020-12-05 MED ORDER — KETOROLAC TROMETHAMINE 30 MG/ML IJ SOLN
30.0000 mg | Freq: Once | INTRAMUSCULAR | Status: AC
  Administered 2020-12-05: 30 mg via INTRAMUSCULAR
  Filled 2020-12-05: qty 1

## 2020-12-05 NOTE — ED Triage Notes (Signed)
Pt reports L arm pain that started on 3/7. His PCP prescribed gabapentin. Pain has not decreased. Denies injury.

## 2020-12-05 NOTE — ED Provider Notes (Signed)
[facility] DEPT Provider Note   CSN: [id number] Arrival date & time: 12/05/20  1945     History Chief Complaint  Patient presents with  . Arm Pain    Roberto Watson is a 60 y.o. male.  HPI 60 year old male presents with left arm pain.  Started about 2-3 weeks ago.  No injuries.  The pain is throughout his entire left arm though primarily in his upper arm.  No chest pain or shortness of breath.  At first the pain has been coming and going but now it is more constant tonight.  Often using his arm makes it worse.  Some pain behind his left shoulder.  No neck pain, weakness, or numbness.  He has been taking gabapentin 100 mg 3 times daily with good relief except for tonight.  Has occasionally taken aspirin but no other meds for pain. Had a shoulder xray at [facility] [place] [facility] a week or 2 ago that was reportedly normal.   Past Medical History:  Diagnosis Date  . Seizures ([place])     There are no problems to display for this patient.   Past Surgical History:  Procedure Laterality Date  . HERNIA REPAIR         History reviewed. No pertinent family history.  Social History   Tobacco Use  . Smoking status: Never Smoker  . Smokeless tobacco: Never Used  Vaping Use  . Vaping Use: Never used  Substance Use Topics  . Alcohol use: No    Comment: social   . Drug use: No    Home Medications Prior to Admission medications   Medication Sig Start Date End Date Taking? Authorizing Provider  cyclobenzaprine (FLEXERIL) 10 MG tablet Take 1 tablet (10 mg total) by mouth 2 (two) times daily as needed for muscle spasms. 04/17/20   Alroy Bailiff, [place], PA-C  ibuprofen (ADVIL) 600 MG tablet Take 1 tablet (600 mg total) by mouth every 6 (six) hours as needed. 04/17/20   Eustaquio Maize, PA-C  meclizine (ANTIVERT) 25 MG tablet Take 1 tablet (25 mg total) by mouth 3 (three) times daily as needed for dizziness. 02/21/19   Drenda Freeze, MD    Allergies     Penicillins  Review of Systems   Review of Systems  Musculoskeletal: Positive for myalgias. Negative for joint swelling.  Neurological: Negative for weakness and numbness.    Physical Exam Updated Vital Signs BP (!) 170/121 (BP Location: Right Arm)   Pulse 92   Temp 98.4 F (36.9 C) (Oral)   Resp 18   SpO2 100%   Physical Exam Vitals and nursing note reviewed.  Constitutional:      General: He is not in acute distress.    Appearance: He is well-developed. He is not ill-appearing or diaphoretic.  HENT:     Head: Normocephalic and atraumatic.     Right Ear: External ear normal.     Left Ear: External ear normal.     Nose: Nose normal.  Eyes:     General:        Right eye: No discharge.        Left eye: No discharge.  Cardiovascular:     Rate and Rhythm: Normal rate and regular rhythm.     Pulses:          Radial pulses are 2+ on the left side.     Heart sounds: Normal heart sounds.  Pulmonary:     Effort: Pulmonary effort is normal.  Breath sounds: Normal breath sounds.  Abdominal:     General: There is no distension.  Musculoskeletal:     Left shoulder: Tenderness (mild, posterior shoulder) present. Normal range of motion.     Left upper arm: No swelling or tenderness.     Left elbow: Normal range of motion. No tenderness.     Left forearm: No swelling, tenderness or bony tenderness.     Cervical back: Neck supple.     Comments: Normal strength/sensation in LUE Normal radial, ulnar, median nerve testing in left hand  Skin:    General: Skin is warm and dry.  Neurological:     Mental Status: He is alert.  Psychiatric:        Mood and Affect: Mood is not anxious.     ED Results / Procedures / Treatments   Labs (all labs ordered are listed, but only abnormal results are displayed) Labs Reviewed - No data to display  EKG None  Radiology No results found.  Procedures Procedures   Medications Ordered in ED Medications  ketorolac (TORADOL) 30  MG/ML injection 30 mg (has no administration in time range)    ED Course  I have reviewed the triage vital signs and the nursing notes.  Pertinent labs & imaging results that were available during my care of the patient were reviewed by me and considered in my medical decision making (see chart for details).    MDM Rules/Calculators/A&P                          Patient presents with weeks of atraumatic left arm pain. No paresthesias, weakness, numbness or swelling. No neck/chest pain. Unclear etiology, though throughout the years he's had a couple xrays on his left shoulder, with some discomfort there now. Offered ekg which he declines. Low suspicion this is atypical cardiac pain anyway. No neurovascular compromise.  Gabapentin seems to be working and he's on a low dose so will advise him to increase to 200 mg TID and call his PCP in 2 days. Return if symptoms worsen. Will give IM toradol here, advise ibuprofen/tylenol for pain at home as well.  Final Clinical Impression(s) / ED Diagnoses Final diagnoses:  Left arm pain    Rx / DC Orders ED Discharge Orders    None       Sherwood Gambler, MD 12/05/20 2045

## 2020-12-05 NOTE — Discharge Instructions (Addendum)
You may increase your gabapentin to 200 mg 3 times per day. Call your doctor in 2 days for further medication treatment as needed. You may take tylenol and/or ibuprofen for pain.  IMPORTANT PATIENT INSTRUCTIONS:  You have been scheduled for an Outpatient Vascular Study at [facility].    If tomorrow is a Saturday, Sunday or holiday, please go to the [facility] Emergency Department Registration Desk at 11 am tomorrow morning and tell them you are there for a vascular study.   If tomorrow is a weekday (Monday-Friday), please go to [facility] [place] at 11 am and tell them you are there for a vascular study.

## 2020-12-06 ENCOUNTER — Ambulatory Visit (HOSPITAL_COMMUNITY): Payer: 59 | Attending: Emergency Medicine

## 2020-12-08 DIAGNOSIS — Z8673 Personal history of transient ischemic attack (TIA), and cerebral infarction without residual deficits: Secondary | ICD-10-CM | POA: Insufficient documentation

## 2020-12-08 DIAGNOSIS — M79602 Pain in left arm: Secondary | ICD-10-CM | POA: Insufficient documentation

## 2020-12-08 DIAGNOSIS — R9431 Abnormal electrocardiogram [ECG] [EKG]: Secondary | ICD-10-CM | POA: Insufficient documentation

## 2020-12-08 DIAGNOSIS — I10 Essential (primary) hypertension: Secondary | ICD-10-CM | POA: Insufficient documentation

## 2020-12-08 DIAGNOSIS — R42 Dizziness and giddiness: Secondary | ICD-10-CM | POA: Insufficient documentation

## 2020-12-22 DIAGNOSIS — J302 Other seasonal allergic rhinitis: Secondary | ICD-10-CM | POA: Insufficient documentation

## 2020-12-22 DIAGNOSIS — E7800 Pure hypercholesterolemia, unspecified: Secondary | ICD-10-CM | POA: Insufficient documentation

## 2020-12-22 DIAGNOSIS — R209 Unspecified disturbances of skin sensation: Secondary | ICD-10-CM | POA: Insufficient documentation

## 2020-12-22 DIAGNOSIS — Z87898 Personal history of other specified conditions: Secondary | ICD-10-CM | POA: Insufficient documentation

## 2021-09-22 DIAGNOSIS — R972 Elevated prostate specific antigen [PSA]: Secondary | ICD-10-CM | POA: Insufficient documentation

## 2021-09-22 DIAGNOSIS — N402 Nodular prostate without lower urinary tract symptoms: Secondary | ICD-10-CM | POA: Insufficient documentation

## 2021-10-19 ENCOUNTER — Other Ambulatory Visit (HOSPITAL_COMMUNITY): Payer: Self-pay | Admitting: Urology

## 2021-10-19 DIAGNOSIS — C61 Malignant neoplasm of prostate: Secondary | ICD-10-CM

## 2021-11-01 NOTE — Progress Notes (Addendum)
GU Location of Tumor / Histology: Prostate Ca  If Prostate Cancer, Gleason Score is (4 + 3) and PSA is (40.93 as of 10/14/2021)  Biopsies  Dr. Rosana Hoes  Transrectal ultrasound was done in the transverse and sagittal planes. Transrectal local anesthesia ws given with 2% Lidocaine with bicarb under ultrasound guidance. Transrectal biopsy of the prostate was done under ultrasound guidance, and 12 cores were obtained Volume of the prostate was 45.53 grams.  PSA: 40.93 ng/ml   A. 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 Cribriform pattern is present.    B. PROSTATE, LEFT MIDDLE, BIOPSY:  Prostatic adenocarcinoma, Grade group 3, combined Gleason score: 4 + 3 = 7 (80% of Gleason pattern 4), involving 2 of 2 core fragments, approximately 90% of total core length. Cribriform pattern is present.   C. PROSTATE, LEFT APEX, BIOPSY:  Prostatic adenocarcinoma, Grade group 3, combined Gleason score: 4 + 3 = 7 (60% of Gleason pattern 4), involving 2 of 2 core fragments, approximately 50% of total core length. Cribriform pattern is present.   D. PROSTATE, RIGHT BASE, BIOPSY:  Prostatic adenocarcinoma, Grade group 4, combined Gleason score: 4 + 4 = 8, involving 2 of 2 core fragments, approximately 90% of total core length. Cribriform pattern is present.  See comment.  E. PROSTATE, RIGHT MIDDLE, BIOPSY:  Prostatic adenocarcinoma, Grade group 4, combined Gleason score: 4 + 4 = 8, involving 2 of 2 core fragments, approximately 90% of total core length. Cribriform pattern is present.  Perineural invasion is present. See comment.  F. PROSTATE, RIGHT APEX, BIOPSY:  Prostatic adenocarcinoma, Grade group 2, combined Gleason score: 3 + 4 = 7 (30% of Gleason pattern 4), involving 2 of 2 core fragments, approximately 90% of total core  length. Cribriform pattern is present.      Past/Anticipated interventions by urology, if any:   Past/Anticipated interventions by medical oncology, if any:   Weight changes, if any:  No  IPSS:  8 SHIM:  16  Bowel/Bladder complaints, if any:  No bowel and bladder issues.  Nausea/Vomiting, if any: No  Pain issues, if any:  0/10  SAFETY ISSUES: Prior radiation?  No Pacemaker/ICD? No Possible current pregnancy?  Male Is the patient on methotrexate? No  Current Complaints / other details:

## 2021-11-02 ENCOUNTER — Ambulatory Visit (HOSPITAL_COMMUNITY): Payer: BLUE CROSS/BLUE SHIELD

## 2021-11-05 ENCOUNTER — Other Ambulatory Visit: Payer: Self-pay

## 2021-11-05 ENCOUNTER — Ambulatory Visit
Admission: RE | Admit: 2021-11-05 | Discharge: 2021-11-05 | Disposition: A | Discharge status: Home / Self Care | Payer: BLUE CROSS/BLUE SHIELD | Source: Ambulatory Visit | Attending: Radiation Oncology | Admitting: Radiation Oncology

## 2021-11-05 ENCOUNTER — Ambulatory Visit
Admission: RE | Admit: 2021-11-05 | Discharge: 2021-11-05 | Disposition: A | Discharge status: Home / Self Care | Payer: Self-pay | Source: Ambulatory Visit | Attending: Radiation Oncology | Admitting: Radiation Oncology

## 2021-11-05 VITALS — BP 139/94 | HR 74 | Temp 97.8°F | Resp 20 | Ht 69.0 in | Wt 197.8 lb

## 2021-11-05 DIAGNOSIS — Z791 Long term (current) use of non-steroidal anti-inflammatories (NSAID): Secondary | ICD-10-CM | POA: Insufficient documentation

## 2021-11-05 DIAGNOSIS — G40909 Epilepsy, unspecified, not intractable, without status epilepticus: Secondary | ICD-10-CM | POA: Insufficient documentation

## 2021-11-05 DIAGNOSIS — C61 Malignant neoplasm of prostate: Secondary | ICD-10-CM | POA: Insufficient documentation

## 2021-11-05 NOTE — Progress Notes (Signed)
Radiation Oncology         (336) [phone] ________________________________  Initial Outpatient Consultation  Name: Roberto Watson MRN: [id number]  Date: 11/05/2021  DOB: May 09, 1961  WN:[id number], No Pcp Per (Inactive)  Myrlene Broker, MD   REFERRING PHYSICIAN: Myrlene Broker, MD  DIAGNOSIS: 61 y.o. gentleman with Stage T2a adenocarcinoma of the prostate with Gleason score of 4+4, and PSA of 56.    ICD-10-CM   1. Malignant neoplasm of prostate ([place])  C61       HISTORY OF PRESENT ILLNESS: Roberto Watson is a 61 y.o. male with a diagnosis of prostate cancer. He was noted to have an elevated PSA of 40.93 on routine labs ordered by his primary care physician, Dr. Vista Lawman.  Accordingly, he was referred for evaluation in urology by Dr. Gaynelle Arabian on 08/04/21,  digital rectal examination was performed at that time revealing a left-sided prostate nodule.  A repeat PSA that day showed a further increase at 56 0.03.  This prompted further evaluation with a prostate MRI which was performed on 08/31/21 and showed diffuse signal abnormality throughout, highly concerning for presence of an infiltrate of prostate malignancy, with a more focal area in the right peripheral zone of mid gland (PI-RADS 5).  There was also concern for multifocal extraprostatic disease extension, as well as enlarged and borderline regional lymph nodes. The patient proceeded to MRI fusion transrectal ultrasound with 14 biopsies of the prostate on 10/14/21 under the care and direction of Dr. Rosana Hoes.  The prostate volume measured 45.53 cc.  Out of 14 core biopsies, all 14 were positive.  The maximum Gleason score was 4+4, and this was seen in the right base, right base lateral, right mid and right mid lateral (with perineural invasion). Additionally, Gleason 4+3 was seen in the left apex, left apex lateral, left mid and left mid lateral, and Gleason 3+4 in the right apex and right apex lateral.  A PSMA PET scan was recommended  for disease staging and was initially scheduled for 11/02/2021 but this was canceled due to [facility] being out of network with his insurance, [employer].  They are currently working to get this rescheduled at a facility that is in network.  The patient reviewed the biopsy results with his urologist and he has kindly been referred today for discussion of potential radiation treatment options.   PREVIOUS RADIATION THERAPY: No  PAST MEDICAL HISTORY:  Past Medical History:  Diagnosis Date   Seizures ([place])       PAST SURGICAL HISTORY: Past Surgical History:  Procedure Laterality Date   HERNIA REPAIR      FAMILY HISTORY: No family history on file.  SOCIAL HISTORY:  Social History   Socioeconomic History   Marital status: Married    Spouse name: Not on file   Number of children: Not on file   Years of education: Not on file   Highest education level: Not on file  Occupational History   Not on file  Tobacco Use   Smoking status: Never   Smokeless tobacco: Never  Vaping Use   Vaping Use: Never used  Substance and Sexual Activity   Alcohol use: No    Comment: social    Drug use: No   Sexual activity: Yes  Other Topics Concern   Not on file  Social History Narrative   ** Merged History Encounter **       Social Determinants of Health   Financial Resource Strain: Not  on file  Food Insecurity: Not on file  Transportation Needs: Not on file  Physical Activity: Not on file  Stress: Not on file  Social Connections: Not on file  Intimate Partner Violence: Not on file    ALLERGIES: Penicillins  MEDICATIONS:  Current Outpatient Medications  Medication Sig Dispense Refill   amLODipine-benazepril (LOTREL) 5-10 MG capsule Take 1 capsule by mouth daily.     cyanocobalamin 100 MCG tablet Take 1 tablet by mouth daily.     folic acid (FOLVITE) 1 MG tablet Take 1 tablet by mouth daily.     cyclobenzaprine (FLEXERIL) 10 MG tablet Take 1 tablet (10 mg total) by mouth 2  (two) times daily as needed for muscle spasms. (Patient not taking: Reported on 11/05/2021) 20 tablet 0   ibuprofen (ADVIL) 600 MG tablet Take 1 tablet (600 mg total) by mouth every 6 (six) hours as needed. (Patient not taking: Reported on 11/05/2021) 30 tablet 0   meclizine (ANTIVERT) 25 MG tablet Take 1 tablet (25 mg total) by mouth 3 (three) times daily as needed for dizziness. (Patient not taking: Reported on 11/05/2021) 20 tablet 0   No current facility-administered medications for this encounter.    REVIEW OF SYSTEMS:  On review of systems, the patient reports that he is doing well overall. He denies any chest pain, shortness of breath, cough, fevers, chills, night sweats, unintended weight changes. He denies any bowel disturbances, and denies abdominal pain, nausea or vomiting. He denies any new musculoskeletal or joint aches or pains. His IPSS was 8, indicating mild urinary symptoms with nocturia x2 and frequency. His SHIM was 16, indicating he has moderate erectile dysfunction. A complete review of systems is obtained and is otherwise negative.  PHYSICAL EXAM:  Wt Readings from Last 3 Encounters:  11/05/21 197 lb 12.8 oz (89.7 kg)  04/17/20 185 lb (83.9 kg)  08/26/19 180 lb (81.6 kg)   Temp Readings from Last 3 Encounters:  11/05/21 97.8 F (36.6 C)  12/05/20 98.6 F (37 C) (Oral)  04/17/20 98.2 F (36.8 C)   BP Readings from Last 3 Encounters:  11/05/21 (!) 139/94  12/05/20 (!) 157/98  04/17/20 (!) 179/112   Pulse Readings from Last 3 Encounters:  11/05/21 74  12/05/20 100  04/17/20 68   Pain Assessment Pain Score: 0-No pain/10  In general this is a well appearing African-American male in no acute distress. He's alert and oriented x4 and appropriate throughout the examination. Cardiopulmonary assessment is negative for acute distress, and he exhibits normal effort.     KPS = 100  100 - Normal; no complaints; no evidence of disease. 90   - Able to carry on normal  activity; minor signs or symptoms of disease. 80   - Normal activity with effort; some signs or symptoms of disease. 39   - Cares for self; unable to carry on normal activity or to do active work. 60   - Requires occasional assistance, but is able to care for most of his personal needs. 50   - Requires considerable assistance and frequent medical care. 64   - Disabled; requires special care and assistance. 40   - Severely disabled; hospital admission is indicated although death not imminent. 78   - Very sick; hospital admission necessary; active supportive treatment necessary. 10   - Moribund; fatal processes progressing rapidly. 0     - Dead  Karnofsky DA, Abelmann WH, Craver LS and Burchenal [facility] [phone]) The use of the nitrogen mustards in  the palliative treatment of carcinoma: with particular reference to bronchogenic carcinoma Cancer 1 634-56  LABORATORY DATA:  Lab Results  Component Value Date   WBC 4.6 08/26/2019   HGB 16.4 08/26/2019   HCT 48.9 08/26/2019   MCV 99.8 08/26/2019   PLT 194 08/26/2019   Lab Results  Component Value Date   NA 141 08/26/2019   K 3.8 08/26/2019   CL 104 08/26/2019   CO2 27 08/26/2019   Lab Results  Component Value Date   ALT 24 01/27/2018   AST 23 01/27/2018   ALKPHOS 74 01/27/2018   BILITOT 1.4 (H) 01/27/2018     RADIOGRAPHY: No results found.    IMPRESSION/PLAN: 1. 61 y.o. gentleman with Stage T2a adenocarcinoma of the prostate with Gleason Score of 4+4, and PSA of 56. We discussed the patient's workup and outlined the nature of prostate cancer in this setting. The patient's T stage, Gleason's score, and PSA put him into the high risk group and his prostate MRI showed concern for extraprostatic extension and pelvic lymphadenopathy. We will work to reschedule the PSMA scan to complete his disease staging. Assuming this scan shows only localized or oligometastatic disease, he would be eligible for LT-ADT in combination with 8 weeks of external  radiation. We discussed the available radiation techniques, and focused on the details and logistics of delivery. We discussed and outlined the risks, benefits, short and long-term effects associated with radiotherapy and compared and contrasted these with prostatectomy.  We also detailed the role of ADT in the treatment of high risk prostate cancer and outlined the associated side effects that could be expected with this therapy. He appears to have a good understanding of his disease and our treatment recommendations which are of curative intent.  He was encouraged to ask questions that were answered to his stated satisfaction.  At the conclusion of our conversation, the patient is interested in obtaining the PSMA PET scan as recommended and pending this scan confirms localized or oligometastatic disease only, he is most interested in moving forward with 8 weeks of external beam therapy concurrent with LT-ADT. Our nurse navigator, RN Kathlee Nations, is helping to reschedule his PSMA PET scan since [facility] is considered "out of network" with his insurance. We will share our discussion with Dr. Shirley Muscat and make arrangements for a follow-up visit to start ADT now and we will also coordinate for fiducial marker placement in late April 2023, prior to simulation. The patient appears to have a good understanding of his disease and our treatment recommendations which are of curative intent and is in agreement with the stated plan.  Therefore, we will move forward with treatment planning accordingly, in anticipation of beginning IMRT approximately 2 months after starting ADT.  We enjoyed meeting him today and look forward to continuing to participate in his care.   We personally spent 70 minutes in this encounter including chart review, reviewing radiological studies, meeting face-to-face with the patient, entering orders and completing documentation.    Roberto Johns, PA-C    Tyler Pita, MD  [facility] Direct Dial: [phone]   Fax: [phone] [email or web address]   Skype   [email or web address]   This document serves as a record of services personally performed by Tyler Pita, MD and Freeman Caldron, PA-C. It was created on their behalf by Wilburn Mylar, a trained medical scribe. The creation of this record is based on the scribe's personal observations and the provider's statements to them. This document has  been checked and approved by the attending provider.

## 2021-11-08 NOTE — Progress Notes (Signed)
Patient is scheduled for his PET PSMA scan @ [facility] on 3/7 @ 2pm.   Patient notified verbalized understanding and agreement. Pt provided with number of [phone] if he has any questions or concerns with date and time.

## 2021-11-17 ENCOUNTER — Ambulatory Visit
Admission: RE | Admit: 2021-11-17 | Discharge: 2021-11-17 | Disposition: A | Discharge status: Home / Self Care | Payer: Self-pay | Source: Ambulatory Visit | Attending: Radiation Oncology | Admitting: Radiation Oncology

## 2021-11-17 ENCOUNTER — Other Ambulatory Visit: Payer: Self-pay | Admitting: Radiation Oncology

## 2021-11-17 DIAGNOSIS — C61 Malignant neoplasm of prostate: Secondary | ICD-10-CM

## 2021-11-18 NOTE — Progress Notes (Signed)
RN informed patient of PET PSMA results after reviewing with Ashlyn, PA-C.  Patient verbalized understanding of results.   ? ?Patient is scheduled with Dr. Rosana Hoes at [facility] for [place] on 4/27 and CT Sim on 5/1 @ [place].  Pt aware of appointments.  No further needs at this time, patient agreeable to call with any questions or concerns that may arise.  ?

## 2021-11-26 ENCOUNTER — Ambulatory Visit: Payer: BLUE CROSS/BLUE SHIELD

## 2021-11-26 ENCOUNTER — Ambulatory Visit: Payer: BLUE CROSS/BLUE SHIELD | Admitting: Urology

## 2022-01-04 NOTE — Progress Notes (Signed)
?  Radiation Oncology         (336) [phone] ?________________________________ ? ?Name: Roberto Watson MRN: [id number]  ?Date: 01/10/2022  DOB: May 21, 1961 ? ?SIMULATION AND TREATMENT PLANNING NOTE ? ?  ICD-10-CM   ?1. Malignant neoplasm of prostate ([place])  C61   ?  ? ? ?DIAGNOSIS:  61 y.o. gentleman with Stage T2a adenocarcinoma of the prostate with Gleason score of 4+4, and PSA of 56. ? ?NARRATIVE:  The patient was brought to the [place].  Identity was confirmed.  All relevant records and images related to the planned course of therapy were reviewed.  The patient freely provided informed written consent to proceed with treatment after reviewing the details related to the planned course of therapy. The consent form was witnessed and verified by the simulation staff.  Then, the patient was set-up in a stable reproducible supine position for radiation therapy.  A vacuum lock pillow device was custom fabricated to position his legs in a reproducible immobilized position.  Then, I performed a urethrogram under sterile conditions to identify the prostatic bed.  CT images were obtained.  Surface markings were placed.  The CT images were loaded into the planning software.  Then the prostate bed target, pelvic lymph node target and avoidance structures including the rectum, bladder, bowel and hips were contoured.  Treatment planning then occurred.  The radiation prescription was entered and confirmed.  A total of one complex treatment devices were fabricated. I have requested : Intensity Modulated Radiotherapy (IMRT) is medically necessary for this case for the following reason:  Rectal sparing.. ? ?PLAN:  The patient will receive 45 Gy in 25 fractions of 1.8 Gy, followed by a boost to the prostate and PET positive SV and lymph nodes to a total dose of 75 Gy with 15 additional fractions of 2 Gy. ? ? ?________________________________ ? ?Sheral Apley Tammi Klippel, M.D. ? ?

## 2022-01-07 ENCOUNTER — Telehealth: Payer: Self-pay | Admitting: *Deleted

## 2022-01-07 NOTE — Telephone Encounter (Signed)
CALLED PATIENT TO REMIND OF SIM APPT. FOR 01-10-22 - ARRIVAL TIME- 8:45 AM @ [facility], PATIENT INFORMED TO ARRIVE WITH A FULL BLADDER AND AN EMPTY BOWEL, SPOKE WITH PATIENT AND HE IS AWARE OF THIS APPT. ?

## 2022-01-10 ENCOUNTER — Ambulatory Visit
Admission: RE | Admit: 2022-01-10 | Discharge: 2022-01-10 | Disposition: A | Discharge status: Home / Self Care | Payer: Self-pay | Source: Ambulatory Visit | Attending: Radiation Oncology | Admitting: Radiation Oncology

## 2022-01-10 DIAGNOSIS — C61 Malignant neoplasm of prostate: Secondary | ICD-10-CM | POA: Insufficient documentation

## 2022-01-10 DIAGNOSIS — Z510 Encounter for antineoplastic radiation therapy: Secondary | ICD-10-CM | POA: Insufficient documentation

## 2022-01-19 ENCOUNTER — Other Ambulatory Visit: Payer: Self-pay

## 2022-01-19 ENCOUNTER — Ambulatory Visit
Admission: RE | Admit: 2022-01-19 | Discharge: 2022-01-19 | Disposition: A | Discharge status: Home / Self Care | Payer: Self-pay | Source: Ambulatory Visit | Attending: Radiation Oncology | Admitting: Radiation Oncology

## 2022-01-19 LAB — RAD ONC ARIA SESSION SUMMARY
Course Elapsed Days: 0
Plan Fractions Treated to Date: 1
Plan Prescribed Dose Per Fraction: 1.8 Gy
Plan Total Fractions Prescribed: 25
Plan Total Prescribed Dose: 45 Gy
Reference Point Dosage Given to Date: 1.8 Gy
Reference Point Session Dosage Given: 1.8 Gy
Session Number: 1

## 2022-01-20 ENCOUNTER — Ambulatory Visit
Admission: RE | Admit: 2022-01-20 | Discharge: 2022-01-20 | Disposition: A | Discharge status: Home / Self Care | Payer: Self-pay | Source: Ambulatory Visit | Attending: Radiation Oncology | Admitting: Radiation Oncology

## 2022-01-20 ENCOUNTER — Other Ambulatory Visit: Payer: Self-pay

## 2022-01-20 LAB — RAD ONC ARIA SESSION SUMMARY
Course Elapsed Days: 1
Plan Fractions Treated to Date: 2
Plan Prescribed Dose Per Fraction: 1.8 Gy
Plan Total Fractions Prescribed: 25
Plan Total Prescribed Dose: 45 Gy
Reference Point Dosage Given to Date: 3.6 Gy
Reference Point Session Dosage Given: 1.8 Gy
Session Number: 2

## 2022-01-21 ENCOUNTER — Other Ambulatory Visit: Payer: Self-pay

## 2022-01-21 ENCOUNTER — Ambulatory Visit
Admission: RE | Admit: 2022-01-21 | Discharge: 2022-01-21 | Disposition: A | Discharge status: Home / Self Care | Payer: Self-pay | Source: Ambulatory Visit | Attending: Radiation Oncology | Admitting: Radiation Oncology

## 2022-01-21 LAB — RAD ONC ARIA SESSION SUMMARY
Course Elapsed Days: 2
Plan Fractions Treated to Date: 3
Plan Prescribed Dose Per Fraction: 1.8 Gy
Plan Total Fractions Prescribed: 25
Plan Total Prescribed Dose: 45 Gy
Reference Point Dosage Given to Date: 5.4 Gy
Reference Point Session Dosage Given: 1.8 Gy
Session Number: 3

## 2022-01-24 ENCOUNTER — Ambulatory Visit
Admission: RE | Admit: 2022-01-24 | Discharge: 2022-01-24 | Disposition: A | Discharge status: Home / Self Care | Payer: Self-pay | Source: Ambulatory Visit | Attending: Radiation Oncology | Admitting: Radiation Oncology

## 2022-01-24 ENCOUNTER — Other Ambulatory Visit: Payer: Self-pay

## 2022-01-24 LAB — RAD ONC ARIA SESSION SUMMARY
Course Elapsed Days: 5
Plan Fractions Treated to Date: 4
Plan Prescribed Dose Per Fraction: 1.8 Gy
Plan Total Fractions Prescribed: 25
Plan Total Prescribed Dose: 45 Gy
Reference Point Dosage Given to Date: 7.2 Gy
Reference Point Session Dosage Given: 1.8 Gy
Session Number: 4

## 2022-01-25 ENCOUNTER — Ambulatory Visit
Admission: RE | Admit: 2022-01-25 | Discharge: 2022-01-25 | Disposition: A | Discharge status: Home / Self Care | Payer: Self-pay | Source: Ambulatory Visit | Attending: Radiation Oncology | Admitting: Radiation Oncology

## 2022-01-25 ENCOUNTER — Other Ambulatory Visit: Payer: Self-pay

## 2022-01-25 LAB — RAD ONC ARIA SESSION SUMMARY
Course Elapsed Days: 6
Plan Fractions Treated to Date: 5
Plan Prescribed Dose Per Fraction: 1.8 Gy
Plan Total Fractions Prescribed: 25
Plan Total Prescribed Dose: 45 Gy
Reference Point Dosage Given to Date: 9 Gy
Reference Point Session Dosage Given: 1.8 Gy
Session Number: 5

## 2022-01-26 ENCOUNTER — Ambulatory Visit
Admission: RE | Admit: 2022-01-26 | Discharge: 2022-01-26 | Disposition: A | Discharge status: Home / Self Care | Payer: Self-pay | Source: Ambulatory Visit | Attending: Radiation Oncology | Admitting: Radiation Oncology

## 2022-01-26 ENCOUNTER — Other Ambulatory Visit: Payer: Self-pay

## 2022-01-26 LAB — RAD ONC ARIA SESSION SUMMARY
Course Elapsed Days: 7
Plan Fractions Treated to Date: 6
Plan Prescribed Dose Per Fraction: 1.8 Gy
Plan Total Fractions Prescribed: 25
Plan Total Prescribed Dose: 45 Gy
Reference Point Dosage Given to Date: 10.8 Gy
Reference Point Session Dosage Given: 1.8 Gy
Session Number: 6

## 2022-01-27 ENCOUNTER — Other Ambulatory Visit: Payer: Self-pay

## 2022-01-27 ENCOUNTER — Ambulatory Visit
Admission: RE | Admit: 2022-01-27 | Discharge: 2022-01-27 | Disposition: A | Discharge status: Home / Self Care | Payer: Self-pay | Source: Ambulatory Visit | Attending: Radiation Oncology | Admitting: Radiation Oncology

## 2022-01-27 LAB — RAD ONC ARIA SESSION SUMMARY
Course Elapsed Days: 8
Plan Fractions Treated to Date: 7
Plan Prescribed Dose Per Fraction: 1.8 Gy
Plan Total Fractions Prescribed: 25
Plan Total Prescribed Dose: 45 Gy
Reference Point Dosage Given to Date: 12.6 Gy
Reference Point Session Dosage Given: 1.8 Gy
Session Number: 7

## 2022-01-28 ENCOUNTER — Ambulatory Visit
Admission: RE | Admit: 2022-01-28 | Discharge: 2022-01-28 | Disposition: A | Discharge status: Home / Self Care | Payer: Self-pay | Source: Ambulatory Visit | Attending: Radiation Oncology | Admitting: Radiation Oncology

## 2022-01-28 ENCOUNTER — Other Ambulatory Visit: Payer: Self-pay

## 2022-01-28 LAB — RAD ONC ARIA SESSION SUMMARY
Course Elapsed Days: 9
Plan Fractions Treated to Date: 8
Plan Prescribed Dose Per Fraction: 1.8 Gy
Plan Total Fractions Prescribed: 25
Plan Total Prescribed Dose: 45 Gy
Reference Point Dosage Given to Date: 14.4 Gy
Reference Point Session Dosage Given: 1.8 Gy
Session Number: 8

## 2022-01-31 ENCOUNTER — Ambulatory Visit
Admission: RE | Admit: 2022-01-31 | Discharge: 2022-01-31 | Disposition: A | Discharge status: Home / Self Care | Payer: Self-pay | Source: Ambulatory Visit | Attending: Radiation Oncology | Admitting: Radiation Oncology

## 2022-01-31 ENCOUNTER — Other Ambulatory Visit: Payer: Self-pay

## 2022-01-31 LAB — RAD ONC ARIA SESSION SUMMARY
Course Elapsed Days: 12
Plan Fractions Treated to Date: 9
Plan Prescribed Dose Per Fraction: 1.8 Gy
Plan Total Fractions Prescribed: 25
Plan Total Prescribed Dose: 45 Gy
Reference Point Dosage Given to Date: 16.2 Gy
Reference Point Session Dosage Given: 1.8 Gy
Session Number: 9

## 2022-02-01 ENCOUNTER — Other Ambulatory Visit: Payer: Self-pay

## 2022-02-01 ENCOUNTER — Ambulatory Visit
Admission: RE | Admit: 2022-02-01 | Discharge: 2022-02-01 | Disposition: A | Discharge status: Home / Self Care | Payer: Self-pay | Source: Ambulatory Visit | Attending: Radiation Oncology | Admitting: Radiation Oncology

## 2022-02-01 LAB — RAD ONC ARIA SESSION SUMMARY
Course Elapsed Days: 13
Plan Fractions Treated to Date: 10
Plan Prescribed Dose Per Fraction: 1.8 Gy
Plan Total Fractions Prescribed: 25
Plan Total Prescribed Dose: 45 Gy
Reference Point Dosage Given to Date: 18 Gy
Reference Point Session Dosage Given: 1.8 Gy
Session Number: 10

## 2022-02-02 ENCOUNTER — Other Ambulatory Visit: Payer: Self-pay

## 2022-02-02 ENCOUNTER — Ambulatory Visit
Admission: RE | Admit: 2022-02-02 | Discharge: 2022-02-02 | Disposition: A | Discharge status: Home / Self Care | Payer: Self-pay | Source: Ambulatory Visit | Attending: Radiation Oncology | Admitting: Radiation Oncology

## 2022-02-02 LAB — RAD ONC ARIA SESSION SUMMARY
Course Elapsed Days: 14
Plan Fractions Treated to Date: 11
Plan Prescribed Dose Per Fraction: 1.8 Gy
Plan Total Fractions Prescribed: 25
Plan Total Prescribed Dose: 45 Gy
Reference Point Dosage Given to Date: 19.8 Gy
Reference Point Session Dosage Given: 1.8 Gy
Session Number: 11

## 2022-02-03 ENCOUNTER — Ambulatory Visit
Admission: RE | Admit: 2022-02-03 | Discharge: 2022-02-03 | Disposition: A | Discharge status: Home / Self Care | Payer: Self-pay | Source: Ambulatory Visit | Attending: Radiation Oncology | Admitting: Radiation Oncology

## 2022-02-03 ENCOUNTER — Other Ambulatory Visit: Payer: Self-pay

## 2022-02-03 LAB — RAD ONC ARIA SESSION SUMMARY
Course Elapsed Days: 15
Plan Fractions Treated to Date: 12
Plan Prescribed Dose Per Fraction: 1.8 Gy
Plan Total Fractions Prescribed: 25
Plan Total Prescribed Dose: 45 Gy
Reference Point Dosage Given to Date: 21.6 Gy
Reference Point Session Dosage Given: 1.8 Gy
Session Number: 12

## 2022-02-04 ENCOUNTER — Other Ambulatory Visit: Payer: Self-pay

## 2022-02-04 ENCOUNTER — Ambulatory Visit
Admission: RE | Admit: 2022-02-04 | Discharge: 2022-02-04 | Disposition: A | Discharge status: Home / Self Care | Payer: Self-pay | Source: Ambulatory Visit | Attending: Radiation Oncology | Admitting: Radiation Oncology

## 2022-02-04 LAB — RAD ONC ARIA SESSION SUMMARY
Course Elapsed Days: 16
Plan Fractions Treated to Date: 13
Plan Prescribed Dose Per Fraction: 1.8 Gy
Plan Total Fractions Prescribed: 25
Plan Total Prescribed Dose: 45 Gy
Reference Point Dosage Given to Date: 23.4 Gy
Reference Point Session Dosage Given: 1.8 Gy
Session Number: 13

## 2022-02-08 ENCOUNTER — Other Ambulatory Visit: Payer: Self-pay

## 2022-02-08 ENCOUNTER — Ambulatory Visit
Admission: RE | Admit: 2022-02-08 | Discharge: 2022-02-08 | Disposition: A | Discharge status: Home / Self Care | Payer: Self-pay | Source: Ambulatory Visit | Attending: Radiation Oncology | Admitting: Radiation Oncology

## 2022-02-08 LAB — RAD ONC ARIA SESSION SUMMARY
Course Elapsed Days: 20
Plan Fractions Treated to Date: 14
Plan Prescribed Dose Per Fraction: 1.8 Gy
Plan Total Fractions Prescribed: 25
Plan Total Prescribed Dose: 45 Gy
Reference Point Dosage Given to Date: 25.2 Gy
Reference Point Session Dosage Given: 1.8 Gy
Session Number: 14

## 2022-02-09 ENCOUNTER — Other Ambulatory Visit: Payer: Self-pay

## 2022-02-09 ENCOUNTER — Ambulatory Visit
Admission: RE | Admit: 2022-02-09 | Discharge: 2022-02-09 | Disposition: A | Discharge status: Home / Self Care | Payer: Self-pay | Source: Ambulatory Visit | Attending: Radiation Oncology | Admitting: Radiation Oncology

## 2022-02-09 LAB — RAD ONC ARIA SESSION SUMMARY
Course Elapsed Days: 21
Plan Fractions Treated to Date: 15
Plan Prescribed Dose Per Fraction: 1.8 Gy
Plan Total Fractions Prescribed: 25
Plan Total Prescribed Dose: 45 Gy
Reference Point Dosage Given to Date: 27 Gy
Reference Point Session Dosage Given: 1.8 Gy
Session Number: 15

## 2022-02-10 ENCOUNTER — Other Ambulatory Visit: Payer: Self-pay

## 2022-02-10 ENCOUNTER — Ambulatory Visit
Admission: RE | Admit: 2022-02-10 | Discharge: 2022-02-10 | Disposition: A | Discharge status: Home / Self Care | Payer: Self-pay | Source: Ambulatory Visit | Attending: Radiation Oncology | Admitting: Radiation Oncology

## 2022-02-10 DIAGNOSIS — C61 Malignant neoplasm of prostate: Secondary | ICD-10-CM | POA: Insufficient documentation

## 2022-02-10 DIAGNOSIS — Z510 Encounter for antineoplastic radiation therapy: Secondary | ICD-10-CM | POA: Insufficient documentation

## 2022-02-10 LAB — RAD ONC ARIA SESSION SUMMARY
Course Elapsed Days: 22
Plan Fractions Treated to Date: 16
Plan Prescribed Dose Per Fraction: 1.8 Gy
Plan Total Fractions Prescribed: 25
Plan Total Prescribed Dose: 45 Gy
Reference Point Dosage Given to Date: 28.8 Gy
Reference Point Session Dosage Given: 1.8 Gy
Session Number: 16

## 2022-02-11 ENCOUNTER — Other Ambulatory Visit: Payer: Self-pay

## 2022-02-11 ENCOUNTER — Ambulatory Visit
Admission: RE | Admit: 2022-02-11 | Discharge: 2022-02-11 | Disposition: A | Discharge status: Home / Self Care | Payer: Self-pay | Source: Ambulatory Visit | Attending: Radiation Oncology | Admitting: Radiation Oncology

## 2022-02-11 LAB — RAD ONC ARIA SESSION SUMMARY
Course Elapsed Days: 23
Plan Fractions Treated to Date: 17
Plan Prescribed Dose Per Fraction: 1.8 Gy
Plan Total Fractions Prescribed: 25
Plan Total Prescribed Dose: 45 Gy
Reference Point Dosage Given to Date: 30.6 Gy
Reference Point Session Dosage Given: 1.8 Gy
Session Number: 17

## 2022-02-14 ENCOUNTER — Ambulatory Visit
Admission: RE | Admit: 2022-02-14 | Discharge: 2022-02-14 | Disposition: A | Discharge status: Home / Self Care | Payer: Self-pay | Source: Ambulatory Visit | Attending: Radiation Oncology | Admitting: Radiation Oncology

## 2022-02-14 ENCOUNTER — Other Ambulatory Visit: Payer: Self-pay

## 2022-02-14 LAB — RAD ONC ARIA SESSION SUMMARY
Course Elapsed Days: 26
Plan Fractions Treated to Date: 18
Plan Prescribed Dose Per Fraction: 1.8 Gy
Plan Total Fractions Prescribed: 25
Plan Total Prescribed Dose: 45 Gy
Reference Point Dosage Given to Date: 32.4 Gy
Reference Point Session Dosage Given: 1.8 Gy
Session Number: 18

## 2022-02-15 ENCOUNTER — Ambulatory Visit
Admission: RE | Admit: 2022-02-15 | Discharge: 2022-02-15 | Disposition: A | Discharge status: Home / Self Care | Payer: Self-pay | Source: Ambulatory Visit | Attending: Radiation Oncology | Admitting: Radiation Oncology

## 2022-02-15 ENCOUNTER — Other Ambulatory Visit: Payer: Self-pay

## 2022-02-15 LAB — RAD ONC ARIA SESSION SUMMARY
Course Elapsed Days: 27
Plan Fractions Treated to Date: 19
Plan Prescribed Dose Per Fraction: 1.8 Gy
Plan Total Fractions Prescribed: 25
Plan Total Prescribed Dose: 45 Gy
Reference Point Dosage Given to Date: 34.2 Gy
Reference Point Session Dosage Given: 1.8 Gy
Session Number: 19

## 2022-02-16 ENCOUNTER — Other Ambulatory Visit: Payer: Self-pay

## 2022-02-16 ENCOUNTER — Ambulatory Visit
Admission: RE | Admit: 2022-02-16 | Discharge: 2022-02-16 | Disposition: A | Discharge status: Home / Self Care | Payer: Self-pay | Source: Ambulatory Visit | Attending: Radiation Oncology | Admitting: Radiation Oncology

## 2022-02-16 LAB — RAD ONC ARIA SESSION SUMMARY
Course Elapsed Days: 28
Plan Fractions Treated to Date: 20
Plan Prescribed Dose Per Fraction: 1.8 Gy
Plan Total Fractions Prescribed: 25
Plan Total Prescribed Dose: 45 Gy
Reference Point Dosage Given to Date: 36 Gy
Reference Point Session Dosage Given: 1.8 Gy
Session Number: 20

## 2022-02-17 ENCOUNTER — Other Ambulatory Visit: Payer: Self-pay

## 2022-02-17 ENCOUNTER — Ambulatory Visit
Admission: RE | Admit: 2022-02-17 | Discharge: 2022-02-17 | Disposition: A | Discharge status: Home / Self Care | Payer: Self-pay | Source: Ambulatory Visit | Attending: Radiation Oncology | Admitting: Radiation Oncology

## 2022-02-17 LAB — RAD ONC ARIA SESSION SUMMARY
Course Elapsed Days: 29
Plan Fractions Treated to Date: 21
Plan Prescribed Dose Per Fraction: 1.8 Gy
Plan Total Fractions Prescribed: 25
Plan Total Prescribed Dose: 45 Gy
Reference Point Dosage Given to Date: 37.8 Gy
Reference Point Session Dosage Given: 1.8 Gy
Session Number: 21

## 2022-02-18 ENCOUNTER — Ambulatory Visit
Admission: RE | Admit: 2022-02-18 | Discharge: 2022-02-18 | Disposition: A | Discharge status: Home / Self Care | Payer: Self-pay | Source: Ambulatory Visit | Attending: Radiation Oncology | Admitting: Radiation Oncology

## 2022-02-18 ENCOUNTER — Other Ambulatory Visit: Payer: Self-pay

## 2022-02-18 LAB — RAD ONC ARIA SESSION SUMMARY
Course Elapsed Days: 30
Plan Fractions Treated to Date: 22
Plan Prescribed Dose Per Fraction: 1.8 Gy
Plan Total Fractions Prescribed: 25
Plan Total Prescribed Dose: 45 Gy
Reference Point Dosage Given to Date: 39.6 Gy
Reference Point Session Dosage Given: 1.8 Gy
Session Number: 22

## 2022-02-21 ENCOUNTER — Ambulatory Visit
Admission: RE | Admit: 2022-02-21 | Discharge: 2022-02-21 | Disposition: A | Discharge status: Home / Self Care | Payer: Self-pay | Source: Ambulatory Visit | Attending: Radiation Oncology | Admitting: Radiation Oncology

## 2022-02-21 ENCOUNTER — Other Ambulatory Visit: Payer: Self-pay

## 2022-02-21 LAB — RAD ONC ARIA SESSION SUMMARY
Course Elapsed Days: 33
Plan Fractions Treated to Date: 23
Plan Prescribed Dose Per Fraction: 1.8 Gy
Plan Total Fractions Prescribed: 25
Plan Total Prescribed Dose: 45 Gy
Reference Point Dosage Given to Date: 41.4 Gy
Reference Point Session Dosage Given: 1.8 Gy
Session Number: 23

## 2022-02-22 ENCOUNTER — Other Ambulatory Visit: Payer: Self-pay

## 2022-02-22 ENCOUNTER — Ambulatory Visit
Admission: RE | Admit: 2022-02-22 | Discharge: 2022-02-22 | Disposition: A | Discharge status: Home / Self Care | Payer: Self-pay | Source: Ambulatory Visit | Attending: Radiation Oncology | Admitting: Radiation Oncology

## 2022-02-22 LAB — RAD ONC ARIA SESSION SUMMARY
Course Elapsed Days: 34
Plan Fractions Treated to Date: 24
Plan Prescribed Dose Per Fraction: 1.8 Gy
Plan Total Fractions Prescribed: 25
Plan Total Prescribed Dose: 45 Gy
Reference Point Dosage Given to Date: 43.2 Gy
Reference Point Session Dosage Given: 1.8 Gy
Session Number: 24

## 2022-02-23 ENCOUNTER — Other Ambulatory Visit: Payer: Self-pay

## 2022-02-23 ENCOUNTER — Ambulatory Visit
Admission: RE | Admit: 2022-02-23 | Discharge: 2022-02-23 | Disposition: A | Discharge status: Home / Self Care | Payer: Self-pay | Source: Ambulatory Visit | Attending: Radiation Oncology | Admitting: Radiation Oncology

## 2022-02-23 LAB — RAD ONC ARIA SESSION SUMMARY
Course Elapsed Days: 35
Plan Fractions Treated to Date: 25
Plan Prescribed Dose Per Fraction: 1.8 Gy
Plan Total Fractions Prescribed: 25
Plan Total Prescribed Dose: 45 Gy
Reference Point Dosage Given to Date: 45 Gy
Reference Point Session Dosage Given: 1.8 Gy
Session Number: 25

## 2022-02-24 ENCOUNTER — Other Ambulatory Visit: Payer: Self-pay

## 2022-02-24 ENCOUNTER — Ambulatory Visit
Admission: RE | Admit: 2022-02-24 | Discharge: 2022-02-24 | Disposition: A | Discharge status: Home / Self Care | Payer: Self-pay | Source: Ambulatory Visit | Attending: Radiation Oncology | Admitting: Radiation Oncology

## 2022-02-24 LAB — RAD ONC ARIA SESSION SUMMARY
Course Elapsed Days: 36
Plan Fractions Treated to Date: 1
Plan Prescribed Dose Per Fraction: 2 Gy
Plan Total Fractions Prescribed: 15
Plan Total Prescribed Dose: 30 Gy
Reference Point Dosage Given to Date: 47 Gy
Reference Point Session Dosage Given: 2 Gy
Session Number: 26

## 2022-02-25 ENCOUNTER — Ambulatory Visit
Admission: RE | Admit: 2022-02-25 | Discharge: 2022-02-25 | Disposition: A | Discharge status: Home / Self Care | Payer: Self-pay | Source: Ambulatory Visit | Attending: Radiation Oncology | Admitting: Radiation Oncology

## 2022-02-25 ENCOUNTER — Other Ambulatory Visit: Payer: Self-pay

## 2022-02-25 LAB — RAD ONC ARIA SESSION SUMMARY
Course Elapsed Days: 37
Plan Fractions Treated to Date: 2
Plan Prescribed Dose Per Fraction: 2 Gy
Plan Total Fractions Prescribed: 15
Plan Total Prescribed Dose: 30 Gy
Reference Point Dosage Given to Date: 49 Gy
Reference Point Session Dosage Given: 2 Gy
Session Number: 27

## 2022-02-28 ENCOUNTER — Other Ambulatory Visit: Payer: Self-pay

## 2022-02-28 ENCOUNTER — Ambulatory Visit
Admission: RE | Admit: 2022-02-28 | Discharge: 2022-02-28 | Disposition: A | Discharge status: Home / Self Care | Payer: Self-pay | Source: Ambulatory Visit | Attending: Radiation Oncology | Admitting: Radiation Oncology

## 2022-02-28 LAB — RAD ONC ARIA SESSION SUMMARY
Course Elapsed Days: 40
Plan Fractions Treated to Date: 3
Plan Prescribed Dose Per Fraction: 2 Gy
Plan Total Fractions Prescribed: 15
Plan Total Prescribed Dose: 30 Gy
Reference Point Dosage Given to Date: 51 Gy
Reference Point Session Dosage Given: 2 Gy
Session Number: 28

## 2022-03-01 ENCOUNTER — Ambulatory Visit
Admission: RE | Admit: 2022-03-01 | Discharge: 2022-03-01 | Disposition: A | Discharge status: Home / Self Care | Payer: Self-pay | Source: Ambulatory Visit | Attending: Radiation Oncology | Admitting: Radiation Oncology

## 2022-03-01 ENCOUNTER — Other Ambulatory Visit: Payer: Self-pay

## 2022-03-01 LAB — RAD ONC ARIA SESSION SUMMARY
Course Elapsed Days: 41
Plan Fractions Treated to Date: 4
Plan Prescribed Dose Per Fraction: 2 Gy
Plan Total Fractions Prescribed: 15
Plan Total Prescribed Dose: 30 Gy
Reference Point Dosage Given to Date: 53 Gy
Reference Point Session Dosage Given: 2 Gy
Session Number: 29

## 2022-03-02 ENCOUNTER — Other Ambulatory Visit: Payer: Self-pay

## 2022-03-02 ENCOUNTER — Ambulatory Visit
Admission: RE | Admit: 2022-03-02 | Discharge: 2022-03-02 | Disposition: A | Discharge status: Home / Self Care | Payer: Self-pay | Source: Ambulatory Visit | Attending: Radiation Oncology | Admitting: Radiation Oncology

## 2022-03-02 LAB — RAD ONC ARIA SESSION SUMMARY
Course Elapsed Days: 42
Plan Fractions Treated to Date: 5
Plan Prescribed Dose Per Fraction: 2 Gy
Plan Total Fractions Prescribed: 15
Plan Total Prescribed Dose: 30 Gy
Reference Point Dosage Given to Date: 55 Gy
Reference Point Session Dosage Given: 2 Gy
Session Number: 30

## 2022-03-03 ENCOUNTER — Ambulatory Visit
Admission: RE | Admit: 2022-03-03 | Discharge: 2022-03-03 | Disposition: A | Discharge status: Home / Self Care | Payer: Self-pay | Source: Ambulatory Visit | Attending: Radiation Oncology | Admitting: Radiation Oncology

## 2022-03-03 ENCOUNTER — Other Ambulatory Visit: Payer: Self-pay

## 2022-03-03 LAB — RAD ONC ARIA SESSION SUMMARY
Course Elapsed Days: 43
Plan Fractions Treated to Date: 6
Plan Prescribed Dose Per Fraction: 2 Gy
Plan Total Fractions Prescribed: 15
Plan Total Prescribed Dose: 30 Gy
Reference Point Dosage Given to Date: 57 Gy
Reference Point Session Dosage Given: 2 Gy
Session Number: 31

## 2022-03-04 ENCOUNTER — Ambulatory Visit
Admission: RE | Admit: 2022-03-04 | Discharge: 2022-03-04 | Disposition: A | Discharge status: Home / Self Care | Payer: Self-pay | Source: Ambulatory Visit | Attending: Radiation Oncology | Admitting: Radiation Oncology

## 2022-03-04 ENCOUNTER — Other Ambulatory Visit: Payer: Self-pay

## 2022-03-04 LAB — RAD ONC ARIA SESSION SUMMARY
Course Elapsed Days: 44
Plan Fractions Treated to Date: 7
Plan Prescribed Dose Per Fraction: 2 Gy
Plan Total Fractions Prescribed: 15
Plan Total Prescribed Dose: 30 Gy
Reference Point Dosage Given to Date: 59 Gy
Reference Point Session Dosage Given: 2 Gy
Session Number: 32

## 2022-03-07 ENCOUNTER — Other Ambulatory Visit: Payer: Self-pay

## 2022-03-07 ENCOUNTER — Ambulatory Visit
Admission: RE | Admit: 2022-03-07 | Discharge: 2022-03-07 | Disposition: A | Discharge status: Home / Self Care | Payer: Self-pay | Source: Ambulatory Visit | Attending: Radiation Oncology | Admitting: Radiation Oncology

## 2022-03-07 LAB — RAD ONC ARIA SESSION SUMMARY
Course Elapsed Days: 47
Plan Fractions Treated to Date: 8
Plan Prescribed Dose Per Fraction: 2 Gy
Plan Total Fractions Prescribed: 15
Plan Total Prescribed Dose: 30 Gy
Reference Point Dosage Given to Date: 61 Gy
Reference Point Session Dosage Given: 2 Gy
Session Number: 33

## 2022-03-08 ENCOUNTER — Other Ambulatory Visit: Payer: Self-pay

## 2022-03-08 ENCOUNTER — Ambulatory Visit
Admission: RE | Admit: 2022-03-08 | Discharge: 2022-03-08 | Disposition: A | Discharge status: Home / Self Care | Payer: Self-pay | Source: Ambulatory Visit | Attending: Radiation Oncology | Admitting: Radiation Oncology

## 2022-03-08 LAB — RAD ONC ARIA SESSION SUMMARY
Course Elapsed Days: 48
Plan Fractions Treated to Date: 9
Plan Prescribed Dose Per Fraction: 2 Gy
Plan Total Fractions Prescribed: 15
Plan Total Prescribed Dose: 30 Gy
Reference Point Dosage Given to Date: 63 Gy
Reference Point Session Dosage Given: 2 Gy
Session Number: 34

## 2022-03-09 ENCOUNTER — Other Ambulatory Visit: Payer: Self-pay

## 2022-03-09 ENCOUNTER — Ambulatory Visit
Admission: RE | Admit: 2022-03-09 | Discharge: 2022-03-09 | Disposition: A | Discharge status: Home / Self Care | Payer: Self-pay | Source: Ambulatory Visit | Attending: Radiation Oncology | Admitting: Radiation Oncology

## 2022-03-09 LAB — RAD ONC ARIA SESSION SUMMARY
Course Elapsed Days: 49
Plan Fractions Treated to Date: 10
Plan Prescribed Dose Per Fraction: 2 Gy
Plan Total Fractions Prescribed: 15
Plan Total Prescribed Dose: 30 Gy
Reference Point Dosage Given to Date: 65 Gy
Reference Point Session Dosage Given: 2 Gy
Session Number: 35

## 2022-03-10 ENCOUNTER — Other Ambulatory Visit: Payer: Self-pay

## 2022-03-10 ENCOUNTER — Ambulatory Visit
Admission: RE | Admit: 2022-03-10 | Discharge: 2022-03-10 | Disposition: A | Discharge status: Home / Self Care | Payer: Self-pay | Source: Ambulatory Visit | Attending: Radiation Oncology | Admitting: Radiation Oncology

## 2022-03-10 LAB — RAD ONC ARIA SESSION SUMMARY
Course Elapsed Days: 50
Plan Fractions Treated to Date: 11
Plan Prescribed Dose Per Fraction: 2 Gy
Plan Total Fractions Prescribed: 15
Plan Total Prescribed Dose: 30 Gy
Reference Point Dosage Given to Date: 67 Gy
Reference Point Session Dosage Given: 2 Gy
Session Number: 36

## 2022-03-11 ENCOUNTER — Ambulatory Visit
Admission: RE | Admit: 2022-03-11 | Discharge: 2022-03-11 | Disposition: A | Discharge status: Home / Self Care | Payer: Self-pay | Source: Ambulatory Visit | Attending: Radiation Oncology | Admitting: Radiation Oncology

## 2022-03-11 ENCOUNTER — Other Ambulatory Visit: Payer: Self-pay

## 2022-03-11 LAB — RAD ONC ARIA SESSION SUMMARY
Course Elapsed Days: 51
Plan Fractions Treated to Date: 12
Plan Prescribed Dose Per Fraction: 2 Gy
Plan Total Fractions Prescribed: 15
Plan Total Prescribed Dose: 30 Gy
Reference Point Dosage Given to Date: 69 Gy
Reference Point Session Dosage Given: 2 Gy
Session Number: 37

## 2022-03-14 ENCOUNTER — Other Ambulatory Visit: Payer: Self-pay

## 2022-03-14 ENCOUNTER — Ambulatory Visit
Admission: RE | Admit: 2022-03-14 | Discharge: 2022-03-14 | Disposition: A | Discharge status: Home / Self Care | Payer: Self-pay | Source: Ambulatory Visit | Attending: Radiation Oncology | Admitting: Radiation Oncology

## 2022-03-14 DIAGNOSIS — Z510 Encounter for antineoplastic radiation therapy: Secondary | ICD-10-CM | POA: Insufficient documentation

## 2022-03-14 DIAGNOSIS — C61 Malignant neoplasm of prostate: Secondary | ICD-10-CM | POA: Insufficient documentation

## 2022-03-14 LAB — RAD ONC ARIA SESSION SUMMARY
Course Elapsed Days: 54
Plan Fractions Treated to Date: 13
Plan Prescribed Dose Per Fraction: 2 Gy
Plan Total Fractions Prescribed: 15
Plan Total Prescribed Dose: 30 Gy
Reference Point Dosage Given to Date: 71 Gy
Reference Point Session Dosage Given: 2 Gy
Session Number: 38

## 2022-03-16 ENCOUNTER — Other Ambulatory Visit: Payer: Self-pay

## 2022-03-16 ENCOUNTER — Ambulatory Visit
Admission: RE | Admit: 2022-03-16 | Discharge: 2022-03-16 | Disposition: A | Discharge status: Home / Self Care | Payer: Self-pay | Source: Ambulatory Visit | Attending: Radiation Oncology | Admitting: Radiation Oncology

## 2022-03-16 LAB — RAD ONC ARIA SESSION SUMMARY
Course Elapsed Days: 56
Plan Fractions Treated to Date: 14
Plan Prescribed Dose Per Fraction: 2 Gy
Plan Total Fractions Prescribed: 15
Plan Total Prescribed Dose: 30 Gy
Reference Point Dosage Given to Date: 73 Gy
Reference Point Session Dosage Given: 2 Gy
Session Number: 39

## 2022-03-17 ENCOUNTER — Other Ambulatory Visit: Payer: Self-pay

## 2022-03-17 ENCOUNTER — Encounter: Payer: Self-pay | Admitting: Urology

## 2022-03-17 ENCOUNTER — Ambulatory Visit
Admission: RE | Admit: 2022-03-17 | Discharge: 2022-03-17 | Disposition: A | Discharge status: Home / Self Care | Payer: Self-pay | Source: Ambulatory Visit | Attending: Radiation Oncology | Admitting: Radiation Oncology

## 2022-03-17 DIAGNOSIS — C61 Malignant neoplasm of prostate: Secondary | ICD-10-CM

## 2022-03-17 LAB — RAD ONC ARIA SESSION SUMMARY
Course Elapsed Days: 57
Plan Fractions Treated to Date: 15
Plan Prescribed Dose Per Fraction: 2 Gy
Plan Total Fractions Prescribed: 15
Plan Total Prescribed Dose: 30 Gy
Reference Point Dosage Given to Date: 75 Gy
Reference Point Session Dosage Given: 2 Gy
Session Number: 40

## 2022-04-26 ENCOUNTER — Encounter: Payer: Self-pay | Admitting: Urology

## 2022-04-26 NOTE — Progress Notes (Signed)
Telephone appointment. I verified patient's identity and began nursing interview. Patient reports urinary urgency. No other issues reported at this time.  Meaningful use complete. I-PSS score of 6-mild. No urinary management medications. Urology appt- None.  Reminded patient of their 9:00am-04/27/22 telephone appointment w/ Ashlyn Bruning PA-C. I left my extension [phone] in case patient needs anything. Patient verbalized understanding.  Patient contact [phone]

## 2022-04-27 ENCOUNTER — Telehealth: Payer: Self-pay | Admitting: Urology

## 2022-04-27 ENCOUNTER — Other Ambulatory Visit: Payer: Self-pay | Admitting: Urology

## 2022-04-27 ENCOUNTER — Ambulatory Visit
Admission: RE | Admit: 2022-04-27 | Discharge: 2022-04-27 | Disposition: A | Discharge status: Home / Self Care | Payer: Self-pay | Source: Ambulatory Visit | Attending: Urology | Admitting: Urology

## 2022-04-27 DIAGNOSIS — C61 Malignant neoplasm of prostate: Secondary | ICD-10-CM

## 2022-04-27 NOTE — Progress Notes (Signed)
Radiation Oncology         (336) [phone] ________________________________  Name: BERKELEY VANAKEN MRN: [id number]  Date: 04/27/2022  DOB: October 25, 1960  Post Treatment Note  CC: Benito Mccreedy, MD  Myrlene Broker, MD  Diagnosis:   61 y.o. gentleman with Stage T2a adenocarcinoma of the prostate with Gleason score of 4+4, and PSA of 56.  Interval Since Last Radiation:  6 weeks; concurrent with LT-ADT (6 month Lupron injection 11/17/21)   01/19/22 - 03/17/20: 1. The prostate, seminal vesicles, and pelvic lymph nodes were initially treated to 45 Gy in 25 fractions of 1.8 Gy  2. The prostate and PET positive SV and lymph nodes  were boosted to 75 Gy with 15 additional fractions of 2.0 Gy   Narrative:  I spoke with the patient to conduct his routine scheduled 1 month follow up visit via telephone to spare the patient unnecessary potential exposure in the healthcare setting during the current COVID-19 pandemic.  The patient was notified in advance and gave permission to proceed with this visit format.  He tolerated radiation treatment relatively well with only minor urinary irritation and modest fatigue.  He did report increased nocturia 5-6 times per night and changes in the consistency of BMs.                              On review of systems, the patient states that he is doing very well in general.  His LUTS are gradually improving and he is almost back to his baseline at this point.  He continues with nocturia 1-2 times per night as well as some mild residual urgency but significantly improved since completing treatment.  He specifically denies dysuria, gross hematuria, straining to void, incomplete bladder emptying or incontinence.  He reports a healthy appetite and is maintaining his weight.  He denies abdominal pain, nausea, vomiting, diarrhea or constipation.  His energy level is much improved as well and overall he is quite pleased with his progress to date.  His only concern at this point is  difficulty achieving erections which is having a significant impact on his quality of life and relationship with his wife.  He has not tried any medications for this.  ALLERGIES:  is allergic to penicillins.  Meds: Current Outpatient Medications  Medication Sig Dispense Refill   amLODipine-benazepril (LOTREL) 5-10 MG capsule Take 1 capsule by mouth daily.     cyanocobalamin 100 MCG tablet Take 1 tablet by mouth daily.     cyclobenzaprine (FLEXERIL) 10 MG tablet Take 1 tablet (10 mg total) by mouth 2 (two) times daily as needed for muscle spasms. (Patient not taking: Reported on 11/05/2021) 20 tablet 0   folic acid (FOLVITE) 1 MG tablet Take 1 tablet by mouth daily.     ibuprofen (ADVIL) 600 MG tablet Take 1 tablet (600 mg total) by mouth every 6 (six) hours as needed. (Patient not taking: Reported on 11/05/2021) 30 tablet 0   meclizine (ANTIVERT) 25 MG tablet Take 1 tablet (25 mg total) by mouth 3 (three) times daily as needed for dizziness. (Patient not taking: Reported on 11/05/2021) 20 tablet 0   No current facility-administered medications for this encounter.    Physical Findings:  vitals were not taken for this visit.  Pain Assessment Pain Score: 0-No pain/10 Unable to assess due to telephone follow-up visit format.  Lab Findings: Lab Results  Component Value Date   WBC 4.6 08/26/2019  HGB 16.4 08/26/2019   HCT 48.9 08/26/2019   MCV 99.8 08/26/2019   PLT 194 08/26/2019     Radiographic Findings: No results found.  Impression/Plan: 1. 61 y.o. gentleman with Stage T2a adenocarcinoma of the prostate with Gleason score of 4+4, and PSA of 56. He will continue to follow up with urology for ongoing PSA determinations but does not have any scheduled follow-up appointments with Dr. Rosana Hoes to his knowledge but will be due for his next 64-monthADT injection in September 2023. He understands what to expect with regards to PSA monitoring going forward. I will look forward to following his  response to treatment via correspondence with urology, and would be happy to continue to participate in his care if clinically indicated. I talked to the patient about what to expect in the future, including his risk for erectile dysfunction and rectal bleeding.  He is currently experiencing difficulty obtaining an erection which is likely secondary to the testosterone suppression with ADT.  He is interested in trying a PDE 5 inhibitor so I have advised him to call Dr. DShann Medaloffice to request samples and or a prescription of daily Cialis or Viagra.  I encouraged him to call or return to the office if he has any questions regarding his previous radiation or possible radiation side effects. He was comfortable with this plan and will follow up as needed.     ANicholos Johns PA-C

## 2022-04-27 NOTE — Telephone Encounter (Signed)
I attempted to reach the patient to conduct his scheduled 1 month follow-up via telephone but did not get any answer.  I did leave a detailed voicemail message for him requesting that he return my call at his earliest convenience so that I can answer any questions that he might have regarding treatment and touch base to make sure that he is on track for follow-up with urology, with Dr. Rosana Hoes, going forward.  Nicholos Johns, MMS, PA-C [place] [facility] [place]: [phone]  Fax: [phone]

## 2022-04-27 NOTE — Progress Notes (Signed)
  Radiation Oncology         (336) [phone] ________________________________  Name: Roberto Watson MRN: [id number]  Date: 03/17/2022  DOB: 1961-08-15  End of Treatment Note  Diagnosis:    61 y.o. gentleman with Stage T2a adenocarcinoma of the prostate with Gleason score of 4+4, and PSA of 56.       Indication for treatment:  Curative, Definitive Radiotherapy       Radiation treatment dates:   01/19/22 - 03/17/20  Site/dose:  1. The prostate, seminal vesicles, and pelvic lymph nodes were initially treated to 45 Gy in 25 fractions of 1.8 Gy  2. The prostate and PET positive SV and lymph nodes  were boosted to 75 Gy with 15 additional fractions of 2.0 Gy   Beams/energy:  1. The prostate, seminal vesicles, and pelvic lymph nodes were initially treated using VMAT intensity modulated radiotherapy delivering 6 megavolt photons. Image guidance was performed with CB-CT studies prior to each fraction. He was immobilized with a body fix lower extremity mold.  2. The prostate and PET positive SV and lymph nodes were boosted using VMAT intensity modulated radiotherapy delivering 6 megavolt photons. Image guidance was performed with CB-CT studies prior to each fraction. He was immobilized with a body fix lower extremity mold.  Narrative: The patient tolerated radiation treatment relatively well with only minor urinary irritation and modest fatigue.  He did report increased nocturia 5-6 times per night and loose stools/diarrhea.  Plan: The patient has completed radiation treatment. He will return to radiation oncology clinic for routine followup in one month. I advised him to call or return sooner if he has any questions or concerns related to his recovery or treatment. ________________________________  Sheral Apley. Tammi Klippel, M.D.

## 2022-04-27 NOTE — Progress Notes (Signed)
Opened in error

## 2022-05-09 NOTE — Progress Notes (Signed)
RN left voicemail with patient regarding following up with Dr. Rosana Hoes, urologist.   RN successfully faxed recent progress notes to Dr. Rosana Hoes office with request to schedule follow up with ADT and discuss starting PDE 5 inhibitor.

## 2022-05-13 NOTE — Progress Notes (Signed)
RN reached out to Dr. Rosana Hoes office to review upcoming appointments for patient to continue on ADT.   Currently patient does not have any upcoming appointments, Dr. Rosana Hoes office will reach out to patient to get scheduled.   RN reached out to patient to update, and patient agreeable to contact Dr. Rosana Hoes office to ensure he gets appointment.   No further needs at this time.

## 2022-05-24 NOTE — Progress Notes (Signed)
RN reached out to patient to confirm appointment with Dr. Rosana Hoes to follow with continuation of ADT.   Per patient he was told they could not schedule because they did not have records of him needing injection.   RN placed call to Dr. Rosana Hoes office and left voicemail with Dr. Rosana Hoes nurse.  Awaiting call back.  Will continue to follow to ensure coordination of care.

## 2022-05-26 NOTE — Progress Notes (Signed)
RN left voicemail with Dr. Rosana Hoes' office regarding scheduling additional ADT appointment.

## 2022-05-26 NOTE — Progress Notes (Signed)
RN spoke with Mardene Celeste @ Dr. Rosana Hoes office [facility]) regarding request to have patient set up for ADT injection.   Per Mardene Celeste they will review with Dr. Rosana Hoes and contact patient to get scheduled.   RN notified patient, patient verbalized understanding.

## 2022-06-06 ENCOUNTER — Encounter: Payer: Self-pay | Admitting: *Deleted

## 2022-07-15 ENCOUNTER — Encounter: Payer: Self-pay | Admitting: *Deleted

## 2022-08-09 ENCOUNTER — Inpatient Hospital Stay: Payer: Self-pay | Admitting: *Deleted

## 2022-08-09 ENCOUNTER — Encounter: Payer: Self-pay | Admitting: *Deleted

## 2022-08-12 ENCOUNTER — Encounter: Payer: Self-pay | Admitting: *Deleted

## 2022-08-19 ENCOUNTER — Encounter: Payer: Self-pay | Admitting: *Deleted

## 2022-08-19 ENCOUNTER — Inpatient Hospital Stay: Payer: Self-pay | Admitting: *Deleted

## 2022-08-26 ENCOUNTER — Encounter: Payer: Self-pay | Admitting: *Deleted

## 2022-08-26 ENCOUNTER — Inpatient Hospital Stay: Payer: Self-pay | Attending: Nurse Practitioner | Admitting: *Deleted

## 2022-08-26 DIAGNOSIS — C61 Malignant neoplasm of prostate: Secondary | ICD-10-CM

## 2022-08-26 NOTE — Progress Notes (Signed)
  2 Identifiers used for verification purposes. No vitals taken this visit as this was a telephone visit. Pt denies pain and fatigue today. Pt seems to have hotflashes sporadically from ADT. He said he has not noticed any other side effects to this point. Last injection was 06/22/22. He receives Lupron every 6 months. Pt states that he sleeps okay and is getting up to bathroom at night only twice now and for the most part he makes it to bathroom on time. He doesn't have to wear the Depends anymore. Urine flow is back to normal, he says. Pt says he mainly exercises at work walking the majority of the shift. He works [employer]. We discussed nutrition and making healthier food choices. Pt denies smoking and drinks only on special occasion. Pt 's was very pleased his last PSA was <0.01 on 08/16/22 with Dr. Rosana Hoes. Pt has never had a colonoscopy. I explained to him the importance of this. He agreed and I put in a referral today.  Pt does not take the flu vaccine. Vaccines reviewed and updated. SCP reviewed and completed.

## 2022-08-26 NOTE — Progress Notes (Signed)
Sent referral for colonoscopy.

## 2022-09-19 ENCOUNTER — Encounter: Payer: Self-pay | Admitting: *Deleted

## 2023-11-15 NOTE — Progress Notes (Signed)
 No chief complaint on file.   HPI:   Roberto Watson is a 63 year old gentleman with known prostate cancer. He is elected to proceed with ADT plus external beam therapy. He received Lupron 45 mg IM on 11/17/2021, 06/22/2022 and 12/22/2022.  His cancer was noted to be high risk with Gleason score up to 8 (4+4).  His original PSA was 56.03 and he is to have 2 years of ADT if possible.  He comes in for a check.  His last testosterone was castrate on 12/22/2022.  Tumor marker pattern over time has been the following:  PSA, Total  Date Value Ref Range Status  12/22/2022 <0.01 0.00 - 4.50 ng/mL Final    Comment:    Results are obtained using a [employer] Coul[employer]'s chemiluminescent immunoassay.  Patient results determined by assays using a different manufacturer and/or method may not be comparable.   HX PROSTATE SPECIFIC AG  Date Value Ref Range Status  08/16/2022 <0.01 0.00 - 4.50 NG/ML Final    Comment:    Results are obtained using [employer] Coul[employer]'s DxI 800, which uses a chemiluminescent immunoassay.  Patient results determined by assays using a different manufacturer and/or method may not be comparable.  08/04/2021 56.03 (H) 0.00 - 4.50 NG/ML Final    Comment:    Results are obtained using [employer] Coul[employer]'s DxI 800, which uses a chemiluminescent immunoassay.  Patient results determined by assays using a different manufacturer and/or method may not be comparable.     Past Medical History:  Diagnosis Date  . Hypertension     Allergies  Allergen Reactions  . Penicillins Anaphylaxis and Other (See Comments)    Has patient had a PCN reaction causing immediate rash, facial/tongue/throat swelling, SOB or lightheadedness with hypotension: unknown, Has patient had a PCN reaction causing severe rash involving mucus membranes or skin necrosis: unknown , as patient had a PCN reaction that required hospitalization unknown, Has patient had a PCN reaction occurring within the last 10 years: no, in college, If all  of the above answers are NO, then may proceed with Cephalosporin use., Has patient had a PCN reaction caus     Current Outpatient Medications:  .  amLODIPine (NORVASC) 10 mg tablet, 10 mg. (Patient not taking: Reported on 12/22/2022), Disp: , Rfl:  .  amlodipine-atorvastatin 5-10 mg tab, Take 1 tablet by mouth daily., Disp: , Rfl:  .  ciprofloxacin (CIPRO) 500 mg tablet, Take 500 mg by mouth 2 (two) times a day. (Patient not taking: Reported on 12/22/2022), Disp: 6 tablet, Rfl: 0 .  cyanocobalamin (VITAMIN B12) 100 mcg tablet, Take 100 mcg by mouth Once Daily., Disp: , Rfl:  .  folic acid (FOLVITE) 1 mg tablet, Take 1 mg by mouth Once Daily., Disp: , Rfl:  .  sodium phosphates (Enema Disposable) 19-7 gram/118 mL enema, Use 1 night prior to procedure and 1 morning of procedure (Patient not taking: Reported on 12/22/2022), Disp: 2 enema, Rfl: 0  No family history on file.  Social History   Socioeconomic History  . Marital status: Married    Spouse name: Not on file  . Number of children: Not on file  . Years of education: Not on file  . Highest education level: Not on file  Occupational History  . Not on file  Tobacco Use  . Smoking status: Never  . Smokeless tobacco: Never  Substance and Sexual Activity  . Alcohol use: Not Currently  . Drug use: Not Currently  . Sexual activity: Not on file  Other Topics Concern  . Not on file  Social History Narrative  . Not on file   Social Drivers of Health   Food Insecurity: No Food Insecurity (12/08/2020)   Received from [facility]   Hunger Vital Sign   . Worried About Programme researcher, broadcasting/film/video in the Last Year: Never true   . Ran Out of Food in the Last Year: Never true  Transportation Needs: Not on file  Safety: Unknown (12/13/2021)   Received from [facility]   HITS   . Physically Hurt: Not on file   . Insult or Talk Down To: Not on file   . Threaten Physical Harm: Not on file   . Scream or Curse: Not on file  Living Situation:  Not on file    Patient Active Problem List   Diagnosis Date Noted  Date Diagnosed  . Malignant neoplasm of prostate (HCC) 11/16/2021   . PSA elevation 09/22/2021   . Prostate nodule without urinary obstruction 09/22/2021     Resolved Problems  No resolved problems to display.    ROS:A comprehensive review of systems was negative.  EXAM: There were no vitals filed for this visit. GENERAL: Vitals reviewed and listed below, alert, oriented, appears well hydrated and in no acute distress HEENT: WNL Neck: Norm ROM LUNGS: Clear to auscultation bilaterally, no wheezes, rales or rhonchi, good air movement CV: HRRR, no peripheral edema ABDOMEN: Soft, non-tender without masses or organomegaly FLANK: GEN RIGHT/LEFT/BIL: does not CVA tenderness noted to palpation EXTREMITIES: Warm and well perfused Neuro:  Intact Skin:  No significant lesions noted PSYCH: Pleasant and cooperative, no obvious depression or anxiety RECTAL: Rectal vault no lesions GU: Prostate 25 grams and smooth.  Penis and testicles normal.  Results for orders placed or performed in visit on 12/22/22  PSA, Total   Collection Time: 12/22/22  2:24 PM  Result Value Ref Range   PSA, Total <0.01 0.00 - 4.50 ng/mL  Testosterone, Total   Collection Time: 12/22/22  2:24 PM  Result Value Ref Range   Testosterone, Total <20 (L) 150 - 684 ng/dL    Assessment:   Patient Active Problem List  Diagnosis  . PSA elevation  . Prostate nodule without urinary obstruction  . Malignant neoplasm of prostate [facility])    Plan:  Diagnoses and all orders for this visit:  Malignant neoplasm of prostate (HCC)  PSA elevation    Risk Assessment:  Overall Roberto Watson is doing well.  He received 3 Lupron 63-month shots and we will hold off the fourth for now.  Will obtain a PSA and testosterone and get the results to him.  If this is okay he will see us  in 6 months or as needed.             No follow-ups on file.    Electronically signed  by: Tanda LITTIE Nicholaus DOUGLAS, MD 11/15/2023 9:04 AM

## 2024-04-18 ENCOUNTER — Encounter: Payer: Self-pay | Admitting: *Deleted

## 2024-04-18 ENCOUNTER — Other Ambulatory Visit: Payer: Self-pay | Admitting: *Deleted

## 2024-04-18 NOTE — Progress Notes (Signed)
 Called pt back to inform him that someone from Dr. Nicholaus will be reaching out to him in 24 hours concerning the blood in stool. Pt has not had any other subsequent bloody stools since he orginally called at 2:56. I also reminded pt that 08/2022 I had put in a referral for him b/c he had never had a  screening.colonoscopy or cologuard. Pt has not gone to to gastro.

## 2024-04-19 ENCOUNTER — Encounter: Payer: Self-pay | Admitting: *Deleted

## 2024-04-19 NOTE — Progress Notes (Signed)
 I have requested primary care doctor for patient and have made an  appt for Sept.29th at 2:20 w/ Dr. Raguel Blush. Pt has been made aware.

## 2024-06-10 ENCOUNTER — Ambulatory Visit: Payer: Self-pay | Admitting: Family Medicine

## 2024-06-19 ENCOUNTER — Observation Stay (HOSPITAL_COMMUNITY): Payer: Self-pay

## 2024-06-19 ENCOUNTER — Emergency Department (HOSPITAL_COMMUNITY): Payer: Self-pay

## 2024-06-19 ENCOUNTER — Other Ambulatory Visit: Payer: Self-pay

## 2024-06-19 ENCOUNTER — Observation Stay (HOSPITAL_COMMUNITY)
Admission: EM | Admit: 2024-06-19 | Discharge: 2024-06-21 | Disposition: A | Discharge status: Home / Self Care | Payer: Self-pay | Attending: Internal Medicine | Admitting: Internal Medicine

## 2024-06-19 ENCOUNTER — Encounter (HOSPITAL_COMMUNITY): Payer: Self-pay

## 2024-06-19 DIAGNOSIS — I6389 Other cerebral infarction: Principal | ICD-10-CM | POA: Insufficient documentation

## 2024-06-19 DIAGNOSIS — Z79899 Other long term (current) drug therapy: Secondary | ICD-10-CM | POA: Insufficient documentation

## 2024-06-19 DIAGNOSIS — R229 Localized swelling, mass and lump, unspecified: Secondary | ICD-10-CM | POA: Insufficient documentation

## 2024-06-19 DIAGNOSIS — I639 Cerebral infarction, unspecified: Principal | ICD-10-CM | POA: Diagnosis present

## 2024-06-19 DIAGNOSIS — Z7982 Long term (current) use of aspirin: Secondary | ICD-10-CM | POA: Insufficient documentation

## 2024-06-19 DIAGNOSIS — J3489 Other specified disorders of nose and nasal sinuses: Secondary | ICD-10-CM

## 2024-06-19 DIAGNOSIS — E7800 Pure hypercholesterolemia, unspecified: Secondary | ICD-10-CM | POA: Insufficient documentation

## 2024-06-19 DIAGNOSIS — Z7901 Long term (current) use of anticoagulants: Secondary | ICD-10-CM | POA: Insufficient documentation

## 2024-06-19 DIAGNOSIS — E785 Hyperlipidemia, unspecified: Secondary | ICD-10-CM | POA: Insufficient documentation

## 2024-06-19 DIAGNOSIS — I10 Essential (primary) hypertension: Secondary | ICD-10-CM | POA: Insufficient documentation

## 2024-06-19 DIAGNOSIS — Z8546 Personal history of malignant neoplasm of prostate: Secondary | ICD-10-CM | POA: Insufficient documentation

## 2024-06-19 DIAGNOSIS — C61 Malignant neoplasm of prostate: Secondary | ICD-10-CM | POA: Diagnosis present

## 2024-06-19 HISTORY — DX: Malignant neoplasm of prostate: C61

## 2024-06-19 LAB — URINALYSIS, ROUTINE W REFLEX MICROSCOPIC
Bacteria, UA: NONE SEEN
Bilirubin Urine: NEGATIVE
Glucose, UA: NEGATIVE mg/dL
Hgb urine dipstick: NEGATIVE
Ketones, ur: NEGATIVE mg/dL
Leukocytes,Ua: NEGATIVE
Nitrite: NEGATIVE
Protein, ur: 30 mg/dL — AB
Specific Gravity, Urine: 1.024 (ref 1.005–1.030)
pH: 5 (ref 5.0–8.0)

## 2024-06-19 LAB — CBC
HCT: 44.8 % (ref 39.0–52.0)
Hemoglobin: 14.6 g/dL (ref 13.0–17.0)
MCH: 32.1 pg (ref 26.0–34.0)
MCHC: 32.6 g/dL (ref 30.0–36.0)
MCV: 98.5 fL (ref 80.0–100.0)
Platelets: 218 K/uL (ref 150–400)
RBC: 4.55 MIL/uL (ref 4.22–5.81)
RDW: 14.3 % (ref 11.5–15.5)
WBC: 3.6 K/uL — ABNORMAL LOW (ref 4.0–10.5)
nRBC: 0 % (ref 0.0–0.2)

## 2024-06-19 LAB — DIFFERENTIAL
Abs Immature Granulocytes: 0.01 K/uL (ref 0.00–0.07)
Basophils Absolute: 0 K/uL (ref 0.0–0.1)
Basophils Relative: 0 %
Eosinophils Absolute: 0 K/uL (ref 0.0–0.5)
Eosinophils Relative: 1 %
Immature Granulocytes: 0 %
Lymphocytes Relative: 22 %
Lymphs Abs: 0.8 K/uL (ref 0.7–4.0)
Monocytes Absolute: 0.5 K/uL (ref 0.1–1.0)
Monocytes Relative: 14 %
Neutro Abs: 2.3 K/uL (ref 1.7–7.7)
Neutrophils Relative %: 63 %

## 2024-06-19 LAB — COMPREHENSIVE METABOLIC PANEL WITH GFR
ALT: 17 U/L (ref 0–44)
AST: 18 U/L (ref 15–41)
Albumin: 4.5 g/dL (ref 3.5–5.0)
Alkaline Phosphatase: 126 U/L (ref 38–126)
Anion gap: 12 (ref 5–15)
BUN: 16 mg/dL (ref 8–23)
CO2: 22 mmol/L (ref 22–32)
Calcium: 9.5 mg/dL (ref 8.9–10.3)
Chloride: 104 mmol/L (ref 98–111)
Creatinine, Ser: 1.1 mg/dL (ref 0.61–1.24)
GFR, Estimated: >60 mL/min (ref >60)
Glucose, Bld: 116 mg/dL — ABNORMAL HIGH (ref 70–99)
Potassium: 3.7 mmol/L (ref 3.5–5.1)
Sodium: 138 mmol/L (ref 135–145)
Total Bilirubin: 1.1 mg/dL (ref 0.0–1.2)
Total Protein: 7.6 g/dL (ref 6.5–8.1)

## 2024-06-19 LAB — PROTIME-INR
INR: 0.9 (ref 0.8–1.2)
Prothrombin Time: 13.1 s (ref 11.4–15.2)

## 2024-06-19 LAB — HEMOGLOBIN A1C
Hgb A1c MFr Bld: 5.2 % (ref 4.8–5.6)
Mean Plasma Glucose: 102.54 mg/dL

## 2024-06-19 LAB — APTT: aPTT: 22 s — ABNORMAL LOW (ref 24–36)

## 2024-06-19 LAB — CBG MONITORING, ED: Glucose-Capillary: 112 mg/dL — ABNORMAL HIGH (ref 70–99)

## 2024-06-19 MED ORDER — IOHEXOL 350 MG/ML SOLN
75.0000 mL | Freq: Once | INTRAVENOUS | Status: AC | PRN
  Administered 2024-06-19: 75 mL via INTRAVENOUS

## 2024-06-19 MED ORDER — ACETAMINOPHEN 650 MG RE SUPP: 650.0000 mg | Freq: Four times a day (QID) | RECTAL | Status: DC | PRN

## 2024-06-19 MED ORDER — ENOXAPARIN SODIUM 40 MG/0.4ML IJ SOSY
40.0000 mg | PREFILLED_SYRINGE | INTRAMUSCULAR | Status: DC
  Administered 2024-06-19 – 2024-06-20 (×2): 40 mg via SUBCUTANEOUS
  Filled 2024-06-19 (×2): qty 0.4

## 2024-06-19 MED ORDER — ONDANSETRON HCL 4 MG/2ML IJ SOLN: 4.0000 mg | Freq: Four times a day (QID) | INTRAMUSCULAR | Status: DC | PRN

## 2024-06-19 MED ORDER — STROKE: EARLY STAGES OF RECOVERY BOOK
Freq: Once | Status: AC
  Filled 2024-06-19: qty 1

## 2024-06-19 MED ORDER — CLOPIDOGREL BISULFATE 75 MG PO TABS
75.0000 mg | ORAL_TABLET | Freq: Every day | ORAL | Status: DC
  Administered 2024-06-19 – 2024-06-21 (×3): 75 mg via ORAL
  Filled 2024-06-19 (×3): qty 1

## 2024-06-19 MED ORDER — ASPIRIN 81 MG PO TBEC
81.0000 mg | DELAYED_RELEASE_TABLET | Freq: Every day | ORAL | Status: DC
  Administered 2024-06-19 – 2024-06-21 (×3): 81 mg via ORAL
  Filled 2024-06-19 (×3): qty 1

## 2024-06-19 MED ORDER — ACETAMINOPHEN 325 MG PO TABS: 650.0000 mg | ORAL_TABLET | Freq: Four times a day (QID) | ORAL | Status: DC | PRN

## 2024-06-19 MED ORDER — ONDANSETRON HCL 4 MG PO TABS: 4.0000 mg | ORAL_TABLET | Freq: Four times a day (QID) | ORAL | Status: DC | PRN
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
## 2024-06-19 NOTE — H&P (Signed)
 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 male with PMH of hypertension, history of TIA, prostate cancer, hyperlipidemia presents to the ED with complaint of dizziness going on for few days.  He initially attributed his symptoms to his high blood pressure as he was out of his blood pressure medication and taking his wife's instead.  He had more difficulty with the right compared to the left with coordination, having sensation of dizziness. He also having double vision and wobbly gait and speech difficulties.Symptoms onset Monday night. He had similar symptoms last year summer with dizziness, wobbly gait  and he has been having decline in his memory.  . Patient otherwise denies any nausea, vomiting, chest pain, shortness of breath, fever, chills, headache, focal weakness, numbness tingling, speech difficulties.   In the ED: Vitals fairly stable heart rate 50s BP 130s-150s, not hypoxic.  Labs CMP CBC fairly unremarkable except for mild leukopenia, INR 0.9 UA unremarkable. MRI brain without CT head without: Acute right cerebellar infarcts-associated edema without significant mass effect. Remote left PCA territory infarcts and remote lacunar infarcts as detailed above. 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 Outpatient ENT evaluation is recommended if not previously obtained. Neurology Dr Matthews was consulted> advised admission at [facility] since no bed availability at [facility] for further workup  Assessment and plan:  Acute right cerebellar stroke: Admit to tele BP goal : Permissive HTN upto 220/110 mmHg  MRI Brain: Already done see result above Will order echocardiogram. Prophylactic therapy- PTA  not ASA> start asa 81 mg Check HgbA1c, fasting lipid panel,High intensity Statin if LDL > 70 PT consult, OT consult, Speech consult Telemetry monitoring Frequent neuro checks Stroke swallow screen   Essential hypertension: Hold patient's Nebivolol due to acute stroke.  Pure hypercholesterolemia: Checking lipid profile.  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   Malignant neoplasm of prostate a year ago: He had XRTx40,and in remission   DVT prophylaxis: enoxaparin (LOVENOX) injection 40 mg Start: 06/19/24 1630 Code Status:   Code Status: Full Code Family Communication: plan of care discussed with patient/wife at bedside. Patient status is: Remains hospitalized because of severity of illness Level of care: Progressive   Dispo: The patient is from: home w/ wife   Objective: Vitals last 24 hrs: Vitals:   06/19/24 1050 06/19/24 1300 06/19/24 1510  BP: (!) 156/104 (!) 138/95 (!) 143/90  Pulse: (!) 58 (!) 56 (!) 51  Resp: 16 15 15   Temp: 98 F (36.7 C)  97.7 F (36.5 C)  TempSrc: Oral    SpO2: 100% 99% 100%  Height:  5' 9 (1.753 m)     Physical Examination: General exam: alert awake, oriented, older than stated age HEENT:Oral mucosa moist, Ear/Nose WNL grossly Respiratory system: Bilaterally clear BS,no use of accessory muscle Cardiovascular system: S1 & S2 +, No JVD. Gastrointestinal system: Abdomen soft,NT,ND, BS+ Nervous System: Alert, awake, moving all extremities,and following commands. Incoordination on right side Extremities: extremities warm, leg edema neg Skin: No rashes,no icterus. MSK: Normal muscle bulk,tone, power   Medications reviewed:  Scheduled Meds:  [START ON 06/20/2024]  stroke: early stages of recovery book   Does not apply Once   enoxaparin (LOVENOX) injection  40 mg Subcutaneous Q24H   Continuous Infusions: Diet: Diet Order             Diet NPO time  specified  Diet effective now                     Severity of Illness: The appropriate patient status for this patient is OBSERVATION. Observation status is judged to be reasonable and necessary in order to provide the required intensity of service to ensure the patient's safety. The patient's presenting symptoms, physical exam findings, and initial radiographic and laboratory data in the context of their medical condition is felt to place them at decreased risk for further clinical deterioration. Furthermore, it is anticipated that the patient will be medically stable for discharge from the hospital within 2 midnights of admission.   Family Communication: Admission, patients condition and plan of care including tests being ordered have been discussed with the patient and wife who indicate understanding and agree with the plan and Code Status.  Consults called:  Neurology  Review of Systems: All systems were reviewed and were negative except as mentioned in HPI above. Negative for fever Negative for chest pain Negative for shortness of breath  Past Medical History:  Diagnosis Date   Prostate cancer (HCC)    Seizures (HCC)     Past Surgical History:  Procedure Laterality Date   HERNIA REPAIR       reports that he has never smoked. He has never used smokeless tobacco. He reports that he does not drink alcohol and does not use drugs.  Allergies  Allergen Reactions   Penicillins Anaphylaxis and Other (See Comments)    Reaction happened when the patient was in college    History reviewed. No pertinent family history.   Prior to Admission medications   Medication Sig Start Date End Date Taking? Authorizing Provider  nebivolol (BYSTOLIC) 5 MG tablet Take 5 mg by mouth daily.   Yes [provider]  cyclobenzaprine  (FLEXERIL ) 10 MG tablet Take 1 tablet (10 mg total) by mouth 2 (two) times daily as needed for muscle spasms. Patient not taking: Reported on 06/19/2024  04/17/20   Venter, Margaux, PA-C  ibuprofen  (ADVIL ) 600 MG tablet Take 1 tablet (600 mg total) by mouth every 6 (six) hours as needed. Patient not taking: Reported on 06/19/2024 04/17/20   Venter, Margaux, PA-C  meclizine  (ANTIVERT ) 25 MG tablet Take 1 tablet (25 mg total) by mouth 3 (three) times daily as needed for dizziness. Patient not taking: Reported on 06/19/2024 02/21/19   Patt Alm Macho, MD   Labs on Admission: I have personally reviewed following labs and imaging studies CBC: Recent Labs  Lab 06/19/24 1043  WBC 3.6*  NEUTROABS 2.3  HGB 14.6  HCT 44.8  MCV 98.5  PLT 218   Basic Metabolic Panel: Recent Labs  Lab 06/19/24 1043  NA 138  K 3.7  CL 104  CO2 22  GLUCOSE 116*  BUN 16  CREATININE 1.10  CALCIUM 9.5   CrCl cannot be calculated (Unknown ideal weight.). Recent Labs  Lab 06/19/24 1043  AST 18  ALT 17  ALKPHOS 126  BILITOT 1.1  PROT 7.6  ALBUMIN 4.5  Coagulation Profile: Recent Labs  Lab 06/19/24 1247  INR 0.9  CBG: Recent Labs  Lab 06/19/24 1301  GLUCAP 112*  Urine analysis:    Component Value Date/Time  COLORURINE YELLOW 06/19/2024 1053   APPEARANCEUR CLEAR 06/19/2024 1053   LABSPEC 1.024 06/19/2024 1053   PHURINE 5.0 06/19/2024 1053   GLUCOSEU NEGATIVE 06/19/2024 1053   HGBUR NEGATIVE 06/19/2024 1053   BILIRUBINUR NEGATIVE 06/19/2024 1053   KETONESUR NEGATIVE 06/19/2024 1053   PROTEINUR 30 (A) 06/19/2024 1053   NITRITE NEGATIVE 06/19/2024 1053   LEUKOCYTESUR NEGATIVE 06/19/2024 1053  Radiological Exams on Admission: CT HEAD WO CONTRAST Result Date: 06/19/2024 EXAM: CT HEAD WITHOUT CONTRAST 06/19/2024 02:24:00 PM TECHNIQUE: CT of the head was performed without the administration of intravenous contrast. Automated exposure control, iterative reconstruction, and/or weight based adjustment of the mA/kV was utilized to reduce the radiation dose to as low as reasonably achievable. COMPARISON: None available. CLINICAL HISTORY: Neuro deficit,  acute, stroke suspected. Dizziness x 2 days. Patient reports running out of his BP medication and has been taking his wife's BP pills which are the same as his. Patient states that his blood pressure has not been high, he has just been feeling dizzy. FINDINGS: BRAIN AND VENTRICLES: No acute hemorrhage. Acute infarcts in the right cerebellum, better characterized on same day MRI. Remote lacunar infarcts in the basal ganglia and left thalamus. Remote left PCA territory infarct. Patchy white matter hypodensities, compatible with chronic microvascular ischemic change. No hydrocephalus. No extra-axial collection. No mass effect or midline shift. ORBITS: No acute abnormality. SINUSES: Suspected mass extending into the right Nasal Cavity with opacified right maxillary sinus, further characterized on same day MRI SOFT TISSUES AND SKULL: No acute soft tissue abnormality. No skull fracture. IMPRESSION: 1. Acute right cerebellar infarcts, better characterized on same-day MRI. 2. Remote lacunar infarcts in the basal ganglia and left thalamus, and remote left PCA territory infarct. 3. Chronic microvascular ischemic changes. 4. Suspected mass extending into the right Nasal Cavity with opacified right maxillary sinus, further characterized on same day MRI. Electronically signed by: Gilmore Molt MD 06/19/2024 02:42 PM EDT RP Workstation: [id number]   MR BRAIN WO CONTRAST Result Date: 06/19/2024 EXAM: MR Brain without Intravenous Contrast. CLINICAL HISTORY: Neuro deficit, acute, stroke suspected. Pt reports with dizziness x 2 days. Acute TECHNIQUE: Magnetic resonance images of the brain without intravenous contrast in multiple planes. CONTRAST: Without. COMPARISON: MRI head 01/27/2018. FINDINGS: BRAIN: Acute right cerebellar infarcts with associated edema. No significant mass effect. Remote left PCA territory infarct. Remote right greater than left basal ganglia lacunar infarcts and remote left thalamic infarct. Patchy white  matter T2/FLAIR hyperintensities, compatible with moderate chronic microvascular ischemic change. No intracranial mass or hemorrhage. No midline shift or extra-axial fluid collection. Major arterial flow voids are maintained at the skull base. VENTRICLES: No hydrocephalus. ORBITS: The orbits are normal. 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 Maxillary sinus is opacified. The mastoid air cells are clear. BONES: No acute fracture or focal osseous lesion. IMPRESSION: 1. Acute right cerebellar infarcts. Associated edema without significant mass effect. 2. Remote left PCA territory infarcts and remote lacunar infarcts as detailed above. 3. 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 Outpatient ENT evaluation is recommended if not previously obtained. Electronically signed by: Gilmore Molt MD 06/19/2024 02:40 PM EDT RP Workstation: [id number]   Mennie LAMY MD [facility]  If 7PM-7AM, please contact night-coverage [email or web address]  06/19/2024, 4:42 PM

## 2024-06-19 NOTE — Plan of Care (Signed)
 Neurology plan of care   Roberto Watson is a 63 y.o. male with PMH of hypertension, history of TIA, prostate cancer, hyperlipidemia presents to the ED with complaint of dizziness going on for few days   This is a 63 year old gentleman with past medical history significant for hypertension, history of TIA, prostate cancer, hyperlipidemia who presented to the ED with complaints of dizziness since Monday and was found to have an acute right cerebellar infarct on MRI.  EDP reached out for initial recommendations and formal consult.  We will be able to see the patient tomorrow when we rounded at [facility].  In the meantime our initial recommendations are as follows:  - Admit for stroke workup - No indication for permissive hypertension greater than 48 hours out from symptom onset.  Goal normotension, avoid hypotension. - CTA or MRA H&N (no carotid US , infarct is in the posterior circulation) - ASA 81mg  daily + plavix 75mg  daily x21 days f/b ASA 81mg  daily monotherapy after that - TTE - Check A1c and LDL + add statin per guidelines (goal <70) - q4 hr neuro checks - STAT head CT for any change in neuro exam - Tele - PT/OT/SLP - Stroke education - Amb referral to neurology upon discharge   Neurology will see patient in formal consultation when we round at [facility] tomorrow. Please reach out to the neurohospitalist on call with any questions in the meantime.  Elida Ross, MD [facility] [phone]  If 7pm- 7am, please page neurology on call as listed in AMION.

## 2024-06-19 NOTE — ED Notes (Signed)
 Cardiac Diet ordered per Shelva, MD.

## 2024-06-19 NOTE — ED Provider Notes (Signed)
 [place] EMERGENCY DEPARTMENT AT [facility] Provider Note   CSN: [id number] Arrival date & time: 06/19/24  1039     Patient presents with: Dizziness   Roberto Watson is a 63 y.o. male.   63 yo M with a chief complaints of dizziness.  He has trouble describing this.  Told me at 1 point he felt he was seeing double.  Seems like it comes and goes.  He thought it was due to his blood pressure and so started taking his wife's blood pressure medication.  Tells me he took multiple doses of this a day and felt his symptoms had still seem to come and go.  He tried to go to a [facility] today and he stepped on the gas instead of the brake a couple times in a row.  He denies difficulty talking or swallowing.  Denies one-sided numbness or weakness.  Denies trauma.  Denies headache.  Denies cough congestion or fever.  Feels like he has been eating and drinking normally.  Denies sensation like he might pass out.  Has felt he has triggered symptoms by standing.  He has a history of prostate cancer but is in remission and his wife is worried that maybe he had a relapse.   Dizziness      Prior to Admission medications   Medication Sig Start Date End Date Taking? Authorizing Provider  amLODipine-benazepril (LOTREL) 10-40 MG capsule Take 1 capsule by mouth daily. 03/29/24  Yes [provider]  amLODipine (NORVASC) 10 MG tablet Take 10 mg by mouth daily. 08/09/22   [provider]  cyanocobalamin 100 MCG tablet Take 1 tablet by mouth daily.    [provider]  cyclobenzaprine  (FLEXERIL ) 10 MG tablet Take 1 tablet (10 mg total) by mouth 2 (two) times daily as needed for muscle spasms. 04/17/20   Shepard, Margaux, PA-C  folic acid (FOLVITE) 1 MG tablet Take 1 tablet by mouth daily.    [provider]  ibuprofen  (ADVIL ) 600 MG tablet Take 1 tablet (600 mg total) by mouth every 6 (six) hours as needed. 04/17/20   Shepard, Margaux, PA-C  meclizine   (ANTIVERT ) 25 MG tablet Take 1 tablet (25 mg total) by mouth 3 (three) times daily as needed for dizziness. 02/21/19   Patt Alm Macho, MD    Allergies: Penicillins    Review of Systems  Neurological:  Positive for dizziness.    Updated Vital Signs BP (!) 143/90   Pulse (!) 51   Temp 98 F (36.7 C) (Oral)   Resp (!) 22   Ht 5' 9 (1.753 m)   SpO2 100%   BMI 29.21 kg/m   Physical Exam Vitals and nursing note reviewed.  Constitutional:      Appearance: He is well-developed.  HENT:     Head: Normocephalic and atraumatic.  Eyes:     Pupils: Pupils are equal, round, and reactive to light.  Neck:     Vascular: No JVD.  Cardiovascular:     Rate and Rhythm: Normal rate and regular rhythm.     Heart sounds: No murmur heard.    No friction rub. No gallop.  Pulmonary:     Effort: No respiratory distress.     Breath sounds: No wheezing.  Abdominal:     General: There is no distension.     Tenderness: There is no abdominal tenderness. There is no guarding or rebound.  Musculoskeletal:        General: Normal range of  motion.     Cervical back: Normal range of motion and neck supple.  Skin:    Coloration: Skin is not pale.     Findings: No rash.  Neurological:     Mental Status: He is alert and oriented to person, place, and time.     Cranial Nerves: Cranial nerves 2-12 are intact.     Sensory: Sensation is intact.     Motor: Motor function is intact.     Comments: Patient is able to accomplish finger-nose and heel-to-shin but he is much less fluid with the right side than the left.  Perhaps diminished shoulder shrug on the right.    Psychiatric:        Behavior: Behavior normal.     (all labs ordered are listed, but only abnormal results are displayed) Labs Reviewed  COMPREHENSIVE METABOLIC PANEL WITH GFR - Abnormal; Notable for the following components:      Result Value   Glucose, Bld 116 (*)    All other components within normal limits  CBC - Abnormal;  Notable for the following components:   WBC 3.6 (*)    All other components within normal limits  URINALYSIS, ROUTINE W REFLEX MICROSCOPIC - Abnormal; Notable for the following components:   Protein, ur 30 (*)    All other components within normal limits  APTT - Abnormal; Notable for the following components:   aPTT 22 (*)    All other components within normal limits  CBG MONITORING, ED - Abnormal; Notable for the following components:   Glucose-Capillary 112 (*)    All other components within normal limits  PROTIME-INR  DIFFERENTIAL  URINE DRUG SCREEN    EKG: EKG Interpretation Date/Time:  Wednesday June 19 2024 10:49:07 EDT Ventricular Rate:  60 PR Interval:  160 QRS Duration:  82 QT Interval:  412 QTC Calculation: 412 R Axis:   79  Text Interpretation: Sinus rhythm Abnormal T, consider ischemia, lateral leads flipped t wave in inferior and lateral leads seen on prior No significant change since last tracing Confirmed by Emil Share [phone]) on 06/19/2024 11:40:32 AM  Radiology: CT HEAD WO CONTRAST Result Date: 06/19/2024 EXAM: CT HEAD WITHOUT CONTRAST 06/19/2024 02:24:00 PM TECHNIQUE: CT of the head was performed without the administration of intravenous contrast. Automated exposure control, iterative reconstruction, and/or weight based adjustment of the mA/kV was utilized to reduce the radiation dose to as low as reasonably achievable. COMPARISON: None available. CLINICAL HISTORY: Neuro deficit, acute, stroke suspected. Dizziness x 2 days. Patient reports running out of his BP medication and has been taking his wife's BP pills which are the same as his. Patient states that his blood pressure has not been high, he has just been feeling dizzy. FINDINGS: BRAIN AND VENTRICLES: No acute hemorrhage. Acute infarcts in the right cerebellum, better characterized on same day MRI. Remote lacunar infarcts in the basal ganglia and left thalamus. Remote left PCA territory infarct. Patchy white  matter hypodensities, compatible with chronic microvascular ischemic change. No hydrocephalus. No extra-axial collection. No mass effect or midline shift. ORBITS: No acute abnormality. SINUSES: Suspected mass extending into the right Nasal Cavity with opacified right maxillary sinus, further characterized on same day MRI SOFT TISSUES AND SKULL: No acute soft tissue abnormality. No skull fracture. IMPRESSION: 1. Acute right cerebellar infarcts, better characterized on same-day MRI. 2. Remote lacunar infarcts in the basal ganglia and left thalamus, and remote left PCA territory infarct. 3. Chronic microvascular ischemic changes. 4. Suspected mass extending into the right Nasal  Cavity with opacified right maxillary sinus, further characterized on same day MRI. Electronically signed by: Gilmore Molt MD 06/19/2024 02:42 PM EDT RP Workstation: [id number]   MR BRAIN WO CONTRAST Result Date: 06/19/2024 EXAM: MR Brain without Intravenous Contrast. CLINICAL HISTORY: Neuro deficit, acute, stroke suspected. Pt reports with dizziness x 2 days. Acute TECHNIQUE: Magnetic resonance images of the brain without intravenous contrast in multiple planes. CONTRAST: Without. COMPARISON: MRI head 01/27/2018. FINDINGS: BRAIN: Acute right cerebellar infarcts with associated edema. No significant mass effect. Remote left PCA territory infarct. Remote right greater than left basal ganglia lacunar infarcts and remote left thalamic infarct. Patchy white matter T2/FLAIR hyperintensities, compatible with moderate chronic microvascular ischemic change. No intracranial mass or hemorrhage. No midline shift or extra-axial fluid collection. Major arterial flow voids are maintained at the skull base. VENTRICLES: No hydrocephalus. ORBITS: The orbits are normal. 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 Maxillary sinus is opacified. The mastoid air cells are clear. BONES: No acute fracture or  focal osseous lesion. IMPRESSION: 1. Acute right cerebellar infarcts. Associated edema without significant mass effect. 2. Remote left PCA territory infarcts and remote lacunar infarcts as detailed above. 3. 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 Outpatient ENT evaluation is recommended if not previously obtained. Electronically signed by: Gilmore Molt MD 06/19/2024 02:40 PM EDT RP Workstation: [id number]     Procedures   Medications Ordered in the ED - No data to display                                  Medical Decision Making Amount and/or Complexity of Data Reviewed Labs: ordered. Radiology: ordered.   63 yo M with a chief complaints of dizziness.  Going on for couple days.  He thought it was may be due to running out of his home blood pressure medication and so started taking his wife's.  He shows me a beta-blocker prescription and sounds like he has taken multiple doses a day.  This certainly could be causing him to feel dizzy though the patient also has one-sided findings on neurologic exam.  No obvious wavering with finger-nose or heel-to-shin but does have some difficulty on the right compared to the left.  He does confirm that he is right-handed.  Will obtain CT and MRI.  Blood work.  MRI is concerning for stroke.  Discussed with Dr. Matthews.  Recommends admission at [facility]  The patients results and plan were reviewed and discussed.   Any x-rays performed were independently reviewed by myself.   Differential diagnosis were considered with the presenting HPI.  Medications - No data to display  Vitals:   06/19/24 1050 06/19/24 1300 06/19/24 1510  BP: (!) 156/104 (!) 138/95 (!) 143/90  Pulse: (!) 58 (!) 56 (!) 51  Resp: 16 15 (!) 22  Temp: 98 F (36.7 C)    TempSrc: Oral    SpO2: 100% 99% 100%  Height:  5' 9 (1.753 m)     Final diagnoses:  Cerebellar stroke (HCC)    Admission/ observation  were discussed with the admitting physician, patient and/or family and they are comfortable with the plan.       Final diagnoses:  Cerebellar stroke [facility])    ED Discharge Orders     None          Emil Share,  DO 06/19/24 1521

## 2024-06-19 NOTE — Hospital Course (Addendum)
 Roberto Watson is a 63 y.o. male with PMH of hypertension, history of TIA, prostate cancer, hyperlipidemia presents to the ED with complaint of dizziness going on for few days.  He initially attributed his symptoms to his high blood pressure as he was out of his blood pressure medication and taking his wife's instead.  He had more difficulty with the right compared to the left with coordination, having sensation of dizziness. He also having double vision and wobbly gait and speech difficulties.Symptoms onset Monday night. He had similar symptoms last year summer with dizziness, wobbly gait  and he has been having decline in his memory.  . Patient otherwise denies any nausea, vomiting, chest pain, shortness of breath, fever, chills, headache, focal weakness, numbness tingling, speech difficulties.   In the ED: Vitals fairly stable heart rate 50s BP 130s-150s, not hypoxic.  Labs CMP CBC fairly unremarkable except for mild leukopenia, INR 0.9 UA unremarkable. MRI brain without CT head without: Acute right cerebellar infarcts-associated edema without significant mass effect. Remote left PCA territory infarcts and remote lacunar infarcts as detailed above. 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 Outpatient ENT evaluation is recommended if not previously obtained. Neurology Dr Matthews was consulted> advised admission at [facility] since no bed availability at [facility] for further workup  Assessment and plan:  Acute right cerebellar stroke: Admit to tele BP goal : Permissive HTN upto 220/110 mmHg  MRI Brain: Already done see result above Will order echocardiogram. Prophylactic therapy- PTA not ASA> start asa 81 mg Check HgbA1c, fasting lipid panel,High intensity Statin if LDL > 70 PT consult, OT consult, Speech consult Telemetry monitoring Frequent neuro checks Stroke swallow screen   Essential hypertension: Hold patient's Nebivolol  due to acute stroke.  Pure hypercholesterolemia: Checking lipid profile.  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   Malignant neoplasm of prostate a year ago: He had XRTx40,and in remission

## 2024-06-20 ENCOUNTER — Observation Stay (HOSPITAL_BASED_OUTPATIENT_CLINIC_OR_DEPARTMENT_OTHER): Payer: Self-pay

## 2024-06-20 DIAGNOSIS — I6389 Other cerebral infarction: Secondary | ICD-10-CM

## 2024-06-20 DIAGNOSIS — G9389 Other specified disorders of brain: Secondary | ICD-10-CM

## 2024-06-20 DIAGNOSIS — I63541 Cerebral infarction due to unspecified occlusion or stenosis of right cerebellar artery: Secondary | ICD-10-CM

## 2024-06-20 DIAGNOSIS — R29700 NIHSS score 0: Secondary | ICD-10-CM

## 2024-06-20 LAB — ECHOCARDIOGRAM COMPLETE
Area-P 1/2: 3.03 cm2
Height: 69 in
MV M vel: 3.99 m/s
MV Peak grad: 63.7 mmHg
S' Lateral: 3.7 cm

## 2024-06-20 LAB — URINE DRUG SCREEN
Amphetamines: NEGATIVE
Barbiturates: NEGATIVE
Benzodiazepines: NEGATIVE
Cocaine: NEGATIVE
Fentanyl: NEGATIVE
Methadone Scn, Ur: NEGATIVE
Opiates: NEGATIVE
Tetrahydrocannabinol: NEGATIVE

## 2024-06-20 LAB — BASIC METABOLIC PANEL WITH GFR
Anion gap: 12 (ref 5–15)
BUN: 19 mg/dL (ref 8–23)
CO2: 23 mmol/L (ref 22–32)
Calcium: 9 mg/dL (ref 8.9–10.3)
Chloride: 105 mmol/L (ref 98–111)
Creatinine, Ser: 1.04 mg/dL (ref 0.61–1.24)
GFR, Estimated: >60 mL/min (ref >60)
Glucose, Bld: 95 mg/dL (ref 70–99)
Potassium: 3.9 mmol/L (ref 3.5–5.1)
Sodium: 141 mmol/L (ref 135–145)

## 2024-06-20 LAB — CBC
HCT: 42.6 % (ref 39.0–52.0)
Hemoglobin: 13.8 g/dL (ref 13.0–17.0)
MCH: 32.2 pg (ref 26.0–34.0)
MCHC: 32.4 g/dL (ref 30.0–36.0)
MCV: 99.3 fL (ref 80.0–100.0)
Platelets: 209 K/uL (ref 150–400)
RBC: 4.29 MIL/uL (ref 4.22–5.81)
RDW: 14.5 % (ref 11.5–15.5)
WBC: 3.5 K/uL — ABNORMAL LOW (ref 4.0–10.5)
nRBC: 0 % (ref 0.0–0.2)

## 2024-06-20 LAB — LIPID PANEL
Cholesterol: 233 mg/dL — ABNORMAL HIGH (ref 0–200)
HDL: 53 mg/dL (ref >40)
LDL Cholesterol: 160 mg/dL — ABNORMAL HIGH (ref 0–99)
Total CHOL/HDL Ratio: 4.4 ratio
Triglycerides: 100 mg/dL (ref <150)
VLDL: 20 mg/dL (ref 0–40)

## 2024-06-20 LAB — TSH: TSH: 1.78 u[IU]/mL (ref 0.350–4.500)

## 2024-06-20 MED ORDER — ATORVASTATIN CALCIUM 40 MG PO TABS
80.0000 mg | ORAL_TABLET | Freq: Every day | ORAL | Status: DC
  Administered 2024-06-20 – 2024-06-21 (×2): 80 mg via ORAL
  Filled 2024-06-20 (×2): qty 2

## 2024-06-20 NOTE — Progress Notes (Signed)
     Patient Name: Roberto Watson           DOB: Aug 03, 1961  MRN: [id number]      Admission Date: 06/19/2024  Attending Provider: [place], Eric J, DO  Primary Diagnosis: Cerebellar stroke, acute (HCC)   Level of care: Telemetry   OVERNIGHT EVENT   Reported painless intermittent hematuria.  Currently on DAPT -- aspirin and plavix for acute cerebellar CVA.  No dysuria or retention. Monitoring for now.  Plan: UA CBC    Lavanda Horns, DNP, ACNPC- AG [facility] [place]

## 2024-06-20 NOTE — Evaluation (Addendum)
 Physical Therapy Evaluation Patient Details Name: Roberto Watson MRN: [id number] DOB: 10/14/1960 Today's Date: 06/20/2024  History of Present Illness  Roberto Watson is a 63 y.o. male with PMH of hypertension, history of TIA, prostate cancer, hyperlipidemia presents to the ED with complaint of dizziness . MRI -acute right cerebellar infarct, MRA:1. New severe right intradural vertebral artery stenosis.2. Chronic basilar artery occlusion with reconstitution. 3. Severe right verterbral artery origin stenosis and moderate V2 stenosis.4. Severe distal left M2 MCA stenosis.5. 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 Recommend ENT referral if not previously obtained.  Clinical Impression   Patient reports  that he is up ad lib to walk to the BR. Patient  reports resolution of his dizziness and  imbalance. Patient  ambulated x 300' with no device, very mild balance  changes when turning fast,  Did run into right side of doorway upon turning into room.Able to turn head and look each direction with no overt loss. Patient denies any dizziness.   PT recommends OPPT and provided list with clinics specializing in Stroke rehab , that he can call for appointment. PT will follow up tomorrow, MD requesting vestibular eval. Patient did not demonstrate any signs or symptoms this visit.    If plan is discharge home, recommend the following: Assist for transportation;Help with stairs or ramp for entrance   Can travel by private vehicle        Equipment Recommendations None recommended by PT  Recommendations for Other Services       Functional Status Assessment Patient has had a recent decline in their functional status and demonstrates the ability to make significant improvements in function in a reasonable and predictable amount of time.     Precautions / Restrictions Precautions Precautions: Fall Restrictions Weight Bearing Restrictions Per Provider Order:  No      Mobility  Bed Mobility Overal bed mobility: (P) Modified Independent                  Transfers Overall transfer level: Independent Equipment used: (P) None Transfers: (P) Sit to/from Stand                  Ambulation/Gait Ambulation/Gait assistance: Independent Gait Distance (Feet): 300 Feet           General Gait Details: intermittent  veers , did run into door frame on right when entered room  Stairs            Wheelchair Mobility     Tilt Bed    Modified Rankin (Stroke Patients Only)       Balance Overall balance assessment: Mild deficits observed, not formally tested                                           Pertinent Vitals/Pain Pain Assessment Pain Assessment: No/denies pain    Home Living Family/patient expects to be discharged to:: Private residence Living Arrangements: Spouse/significant other Available Help at Discharge: Family   Home Access: Stairs to enter   Secretary/administrator of Steps: 1   Home Layout: One level Home Equipment: Shower seat - built in      Prior Function Prior Level of Function : Independent/Modified Independent;Working/employed;Driving                     Extremity/Trunk Assessment   Upper Extremity  Assessment Upper Extremity Assessment: Defer to OT evaluation    Lower Extremity Assessment Lower Extremity Assessment: Overall WFL for tasks assessed    Cervical / Trunk Assessment Cervical / Trunk Assessment: Normal  Communication   Communication Communication: No apparent difficulties    Cognition Arousal: Alert Behavior During Therapy: WFL for tasks assessed/performed                             Following commands: Intact       Cueing Cueing Techniques: Verbal cues     General Comments General comments (skin integrity, edema, etc.): able to stop and start, hm turn head to each side  while ambulating, no dizziness reported, not  overt loss of balance    Exercises     Assessment/Plan    PT Assessment Patient needs continued PT services  PT Problem List Decreased mobility;Decreased knowledge of precautions       PT Treatment Interventions Gait training;Functional mobility training;Therapeutic activities    PT Goals (Current goals can be found in the Care Plan section)  Acute Rehab PT Goals Patient Stated Goal: return to work PT Goal Formulation: With patient Time For Goal Achievement: 07/04/24 Potential to Achieve Goals: Good    Frequency Min 3X/week     Co-evaluation PT/OT/SLP Co-Evaluation/Treatment: Yes Reason for Co-Treatment: Necessary to address cognition/behavior during functional activity PT goals addressed during session: Mobility/safety with mobility;Balance OT goals addressed during session: ADL's and self-care;Proper use of Adaptive equipment and DME       AM-PAC PT 6 Clicks Mobility  Outcome Measure Help needed turning from your back to your side while in a flat bed without using bedrails?: None Help needed moving from lying on your back to sitting on the side of a flat bed without using bedrails?: None Help needed moving to and from a bed to a chair (including a wheelchair)?: None Help needed standing up from a chair using your arms (e.g., wheelchair or bedside chair)?: None Help needed to walk in hospital room?: None Help needed climbing 3-5 steps with a railing? : A Little 6 Click Score: 23    End of Session Equipment Utilized During Treatment: Gait belt Activity Tolerance: Patient tolerated treatment well Patient left: in bed;with call bell/phone within reach Nurse Communication: Mobility status PT Visit Diagnosis: Unsteadiness on feet (R26.81);Other symptoms and signs involving the nervous system (R29.898)    Time: 9080-9054 PT Time Calculation (min) (ACUTE ONLY): 26 min   Charges:   PT Evaluation $PT Eval Low Complexity: 1 Low   PT General Charges $$ ACUTE PT  VISIT: 1 Visit        Darice Potters PT Acute Rehabilitation Services Office [phone]   Potters Darice Norris 06/20/2024, 1:58 PM
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
## 2024-06-20 NOTE — Progress Notes (Signed)
  Echocardiogram 2D Echocardiogram has been performed.  Roberto Watson, RDCS 06/20/2024, 2:02 PM
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
## 2024-06-21 LAB — URINALYSIS, ROUTINE W REFLEX MICROSCOPIC
Bilirubin Urine: NEGATIVE
Glucose, UA: NEGATIVE mg/dL
Ketones, ur: NEGATIVE mg/dL
Leukocytes,Ua: NEGATIVE
Nitrite: NEGATIVE
Protein, ur: 100 mg/dL — AB
RBC / HPF: >50 RBC/hpf (ref 0–5)
Specific Gravity, Urine: 1.023 (ref 1.005–1.030)
pH: 5 (ref 5.0–8.0)

## 2024-06-21 LAB — HIV ANTIBODY (ROUTINE TESTING W REFLEX): HIV Screen 4th Generation wRfx: NONREACTIVE

## 2024-06-21 LAB — CBC
HCT: 41.9 % (ref 39.0–52.0)
Hemoglobin: 13.7 g/dL (ref 13.0–17.0)
MCH: 32.5 pg (ref 26.0–34.0)
MCHC: 32.7 g/dL (ref 30.0–36.0)
MCV: 99.3 fL (ref 80.0–100.0)
Platelets: 198 K/uL (ref 150–400)
RBC: 4.22 MIL/uL (ref 4.22–5.81)
RDW: 14.3 % (ref 11.5–15.5)
WBC: 3.3 K/uL — ABNORMAL LOW (ref 4.0–10.5)
nRBC: 0 % (ref 0.0–0.2)

## 2024-06-21 MED ORDER — CLOPIDOGREL BISULFATE 75 MG PO TABS: 75.0000 mg | ORAL_TABLET | Freq: Every day | ORAL | 0 refills | Status: AC

## 2024-06-21 MED ORDER — CLOPIDOGREL BISULFATE 75 MG PO TABS: 75.0000 mg | ORAL_TABLET | Freq: Every day | ORAL | 0 refills | Status: DC

## 2024-06-21 MED ORDER — ASPIRIN 81 MG PO TBEC: 81.0000 mg | DELAYED_RELEASE_TABLET | Freq: Every day | ORAL | 0 refills | Status: AC

## 2024-06-21 MED ORDER — ATORVASTATIN CALCIUM 80 MG PO TABS: 80.0000 mg | ORAL_TABLET | Freq: Every day | ORAL | 0 refills | Status: AC
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
## 2024-06-26 ENCOUNTER — Encounter (INDEPENDENT_AMBULATORY_CARE_PROVIDER_SITE_OTHER): Payer: Self-pay

## 2024-06-26 ENCOUNTER — Ambulatory Visit (INDEPENDENT_AMBULATORY_CARE_PROVIDER_SITE_OTHER): Payer: Self-pay

## 2024-06-26 VITALS — BP 149/91 | HR 63 | Ht 69.0 in | Wt 190.0 lb

## 2024-06-26 DIAGNOSIS — J3489 Other specified disorders of nose and nasal sinuses: Secondary | ICD-10-CM

## 2024-06-26 NOTE — Progress Notes (Signed)
 Dear Dr. [place], Here is my assessment for our mutual patient, Roberto Watson. Thank you for allowing me the opportunity to care for your patient. Please do not hesitate to contact me should you have any other questions. Sincerely, Dr. Hadassah Parody  Otolaryngology Clinic Note Referring provider: Dr. [place] HPI:   Initial HPI (06/26/2024)  Discussed the use of AI scribe software for clinical note transcription with the patient, who gave verbal consent to proceed.  History of Present Illness Roberto Watson is a 63 year old male who presents for evaluation a sinus mass found on CT scan.  Pt requests that all discussions include his wife, who was called after this visit to discuss plan.   Pt was recently hosptialized for acute cerebellar CVA. Started on DAPT x3 months. 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   Sinonasal mass - CT scan reveals a mass in the right maxillary sinus - No history of prior sinus problems or allergies  - Breathes through both nostrils without difficulty - no prior sinus surgeries   AP/AC: DAPT    PMH - Acute cerebellar CVA - TIA - prostate cancer  Independent Review of Additional Tests or Records:  Discharge summary 06/19/24 Roberto [place], DO: acute cerebellar CVA, incidentally  noted right maxillary sinus mass   CT head 06/19/24 reviewed and shows right maxillary sinus mass extending into nasal cavity, b/l thickening in ethmoids, right frontal sinus thickening    PMH/Meds/All/SocHx/FamHx/ROS:   Past Medical History:  Diagnosis Date   Prostate cancer (HCC)    Seizures (HCC)      Past Surgical History:  Procedure Laterality Date   HERNIA REPAIR      No family history on file.   Social Connections: Moderately Integrated (06/20/2024)   Social Connection and Isolation Panel    Frequency of Communication with Friends and Family: Once a week    Frequency of Social Gatherings with Friends  and Family: Once a week    Attends Religious Services: More than 4 times per year    Active Member of [employer] or Organizations: Yes    Attends Engineer, structural: More than 4 times per year    Marital Status: Married     Current Outpatient Medications  Medication Instructions   aspirin EC 81 mg, Oral, Daily, Swallow whole.   atorvastatin (LIPITOR) 80 mg, Oral, Daily   clopidogrel (PLAVIX) 75 mg, Oral, Daily     Physical Exam:   BP (!) 149/91   Pulse 63   Ht 5' 9 (1.753 m)   Wt 190 lb (86.2 kg)   SpO2 98%   BMI 28.06 kg/m   Salient findings:  CN II- XII intact Cooperative  Bilateral EAC clear and TM intact with well pneumatized middle ear spaces Anterior rhinoscopy: Septum midline anteriorly Nasal endoscopy was indicated to better evaluate the nose and paranasal sinuses, given the patient's history and exam findings, and is detailed below. No lesions of oral cavity/oropharynx No obviously palpable neck masses/lymphadenopathy/thyromegaly No respiratory distress or stridor  Seprately Identifiable Procedures:  Prior to initiating any procedures, risks/benefits/alternatives were explained to the patient and verbal consent obtained.  PROCEDURE (06/26/2024): Bilateral Diagnostic Rigid Nasal Endoscopy Pre-procedure diagnosis: Concern for right maxillary sinus mass  Post-procedure diagnosis: same Indication: See pre-procedure diagnosis and physical exam above Complications: None apparent EBL: 0 mL Anesthesia: Lidocaine 4% and topical decongestant was topically sprayed in each nasal cavity  Description of Procedure:  Patient was identified. A  rigid 30 degree endoscope was utilized to evaluate the sinonasal cavities, mucosa, sinus ostia and turbinates and septum.  Overall, signs of mucosal inflammation are not noted.  Also noted are right maxillary sinus wall bowing outward.  No mucopurulence, polyps, or masses noted.   Right Middle meatus: unable to fully visualize due to  mass Right SE Recess: unable to reach due to mass and intolerance of scope Left MM: clear Left SE Recess: clear Photodocumentation was obtained.  CPT CODE -- 68768 - Mod 25   Impression & Plans:  Roberto Watson is a 63 y.o. male with   1. 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  - I had a long discussion with the patient regarding options.  He asked that I also call his wife to discuss with her, which I was able to do at the end of the visit.  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  Discussed 10% risk of malignant transformation with inverted papilloma.  I recommended surgical removal of the mass. Patient currently is uninsured and is self-pay today.  Performing a surgery without insurance has major financial implications.  Discussed this with his wife and states he will be eligible for Medicare next year.  In the meantime they are hoping to proceed with observation until then versus looking into financial assistance programs.  Discussed referral to [facility] to help with possible financial assistance versus observation and surgery once he is able to apply for Medicare. -They agreed with referral to [facility]   See below regarding exact medications prescribed this encounter including dosages and route: No orders of the defined types were placed in this encounter.   I personally spent a total of 45 minutes in the care of the patient today including preparing to see the patient, counseling and educating, independently interpreting results, and communicating results. Counseling, pt his son and his wife all separately  Thank you for allowing me the opportunity to care for your patient. Please do not hesitate to  contact me should you have any other questions.  Sincerely, Hadassah Parody, MD Otolaryngologist (ENT), [facility] Phone: [phone] Fax: [phone]

## 2024-07-11 ENCOUNTER — Encounter: Payer: Self-pay | Admitting: Neurology

## 2024-07-11 ENCOUNTER — Ambulatory Visit (INDEPENDENT_AMBULATORY_CARE_PROVIDER_SITE_OTHER): Payer: Self-pay | Admitting: Neurology

## 2024-07-11 VITALS — BP 140/90 | HR 93 | Ht 69.75 in | Wt 194.2 lb

## 2024-07-11 DIAGNOSIS — I639 Cerebral infarction, unspecified: Secondary | ICD-10-CM

## 2024-07-11 DIAGNOSIS — G3184 Mild cognitive impairment, so stated: Secondary | ICD-10-CM

## 2024-07-11 DIAGNOSIS — R413 Other amnesia: Secondary | ICD-10-CM

## 2024-07-11 NOTE — Progress Notes (Signed)
 [facility] [address] [place]. [place] [address] [phone]       OFFICE CONSULT NOTE  Mr. Roberto Watson Date of Birth:  [birth year] Medical Record Number:  [id number]   Referring MD: Elida Holter  Reason for Referral: Stroke  HPI: Mr. Roberto Watson is a 63 year old pleasant male seen today for initial office consultation visit for stroke.  History is obtained from the patient and review of electronic medical records.  I personally reviewed pertinent available imaging films in PACS.  He has past medical history of prostate cancer, seizures, remote stroke who presented on 06/19/2024 with sudden onset of dizziness and diplopia which she stated has been going on for a while and he was unable to exactly pinpoint on the days before.  Initially thought his symptoms were related to his blood pressure as he had run out of his blood pressure medication and he took some of his wife's medicines.  He was going through drive-through at [employer] when he stepped on the gas instead of the brake a couple of times and appeared confused.  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  There is severe vertebral artery stenosis at its origin as well as moderate P2 segment stenosis.  There is severe distal left M2 MCA stenosis as well.  MRI scan of the brain confirmed patchy acute right cerebellar infarct with mild cytotoxic edema but without significant mass effect or hydrocephalus.  Remote age left PCA territory infarct as well as bilateral basal ganglia lacunar infarcts of remote age were also noted.  Urine drug screen was negative.  Echocardiogram showed ejection fraction of 60 to 65% with moderate concentric left ventricular hypertrophy.  Left atrial size is normal.  LDL cholesterol 160 mg percent.  Hemoglobin A1c was 5.2.  Patient was  started on dual antiplatelet therapy for 3 weeks and then aspirin alone.  Patient states he is feeling fine.  She is no longer dizzy or having any vision problems.  He does admit to mild short-term memory difficulties but is had for more than a year.  He feels this is unchanged when after the present stroke.  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  On cognitive testing today scored 25/30 on the Mini-Mental but had delayed recall.  He denies any prior history of focal deficits or seizures under MRI showed multiple old strokes.  Denies history of any palpitations, syncopal episodes or significant cardiac disease.  He is tolerating aspirin and Plavix well with minor bruising and no bleeding.  He is tolerating Lipitor well without muscle aches or pains.  CT and MRI had shown a right maxillary sinus mass and patient was referred to ENT for right sinonasal mass extending into nasal cavity with bilateral thickening of the ethmoids and right frontal sinus and was seen by Dr. 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  Patient was recommended to have surgical removal but patient had no insurance and plan was to refer him to [facility] for possible financial assistance versus observation and surgery once he is able to get Medicare..  ROS:   14 system review of systems is positive for dizziness, diplopia, memory loss all other systems negative  PMH:  Past Medical History:  Diagnosis Date   Prostate cancer (HCC)    Seizures (HCC)  Social History:  Social History   Socioeconomic History   Marital status: Married    Spouse name: Not on file   Number of children: Not on file   Years of education: Not on file   Highest education level: Not on file  Occupational History   Not on file  Tobacco Use   Smoking status: Never   Smokeless tobacco: Never  Vaping Use   Vaping status: Never Used  Substance and Sexual Activity   Alcohol use: No    Comment:  social    Drug use: No   Sexual activity: Yes  Other Topics Concern   Not on file  Social History Narrative   ** Merged History Encounter **       Social Drivers of Health   Financial Resource Strain: Not on file  Food Insecurity: No Food Insecurity (06/20/2024)   Hunger Vital Sign    Worried About Running Out of Food in the Last Year: Never true    Ran Out of Food in the Last Year: Never true  Transportation Needs: No Transportation Needs (06/20/2024)   PRAPARE - Administrator, Civil Service (Medical): No    Lack of Transportation (Non-Medical): No  Physical Activity: Not on file  Stress: Not on file  Social Connections: Moderately Integrated (06/20/2024)   Social Connection and Isolation Panel    Frequency of Communication with Friends and Family: Once a week    Frequency of Social Gatherings with Friends and Family: Once a week    Attends Religious Services: More than 4 times per year    Active Member of [employer] or Organizations: Yes    Attends Engineer, Structural: More than 4 times per year    Marital Status: Married  Catering Manager Violence: Not At Risk (06/20/2024)   Humiliation, Afraid, Rape, and Kick questionnaire    Fear of Current or Ex-Partner: No    Emotionally Abused: No    Physically Abused: No    Sexually Abused: No    Medications:   Current Outpatient Medications on File Prior to Visit  Medication Sig Dispense Refill   aspirin EC 81 MG tablet Take 1 tablet (81 mg total) by mouth daily. Swallow whole. 90 tablet 0   atorvastatin (LIPITOR) 80 MG tablet Take 1 tablet (80 mg total) by mouth daily. 90 tablet 0   clopidogrel (PLAVIX) 75 MG tablet Take 1 tablet (75 mg total) by mouth daily. 90 tablet 0   No current facility-administered medications on file prior to visit.    Allergies:   Allergies  Allergen Reactions   Penicillins Anaphylaxis and Other (See Comments)    Reaction happened when the patient was in college    Physical  Exam General: well developed, well nourished, seated, in no evident distress Head: head normocephalic and atraumatic.   Neck: supple with no carotid or supraclavicular bruits Cardiovascular: regular rate and rhythm, no murmurs Musculoskeletal: no deformity Skin:  no rash/petichiae Vascular:  Normal pulses all extremities  Neurologic Exam Mental Status: Awake and fully alert. Oriented to place and time. Recent and remote memory intact. Attention span, concentration and fund of knowledge appropriate. Mood and affect appropriate.  Diminished recall 0/3.  Mini-Mental status exam score 26/30.  Clock drawing 4/4.  Direct depression scale 1 depressed.  Able to name 10 animals which can walk on  4 legs Cranial Nerves: Fundoscopic exam reveals sharp disc margins. Pupils equal, briskly reactive to light. Extraocular movements full without nystagmus. Visual fields full  to confrontation. Hearing intact. Facial sensation intact. Face, tongue, palate moves normally and symmetrically.  Motor: Normal bulk and tone. Normal strength in all tested extremity muscles. Sensory.: intact to touch , pinprick , position and vibratory sensation.  Coordination: Rapid alternating movements normal in all extremities. Finger-to-nose and heel-to-shin performed accurately bilaterally. Gait and Station: Arises from chair without difficulty. Stance is normal. Gait demonstrates normal stride length and balance . Able to heel, toe and tandem walk with moderate difficulty.  Reflexes: 1+ and symmetric. Toes downgoing.   NIHSS  0 Modified Rankin  1    07/11/2024   10:52 AM  MMSE - Mini Mental State Exam  Orientation to time 3  Orientation to Place 4  Registration 3  Attention/ Calculation 5  Recall 2  Language- name 2 objects 2  Language- repeat 1  Language- follow 3 step command 3  Language- read & follow direction 1  Write a sentence 1  Copy design 1  Total score 26     ASSESSMENT: 63 year old African-American male  with right cerebellar infarct and October 2025 likely of cryptogenic etiology with abnormal MRI scan showing multiple silent cortical and subcortical infarcts.  Vascular risk factors of hyperlipidemia, intracranial stenosis and silent cerebrovascular disease he also has mild cognitive impairment.     PLAN:I had a long d/w patient about his recent stroke, silent strokes, vertebral artery stenosis, risk for recurrent stroke/TIAs, personally independently reviewed imaging studies and stroke evaluation results and answered questions.Continue aspirin 81 mg daily and clopidogrel 75 mg daily  for 3 weeks and then aspirin alone for secondary stroke prevention and maintain strict control of hypertension with blood pressure goal below 130/90, diabetes with hemoglobin A1c goal below 6.5% and lipids with LDL cholesterol goal below 70 mg/dL. I also advised the patient to eat a healthy diet with plenty of whole grains, cereals, fruits and vegetables, exercise regularly and maintain ideal body weight .I the patient to increase participation in cognitively challenging activities like solving crossword puzzles, playing bridge and sudoku.  We also discussed memory compensation strategies.  Check memory panel labs, EEG and 30-day heart monitor for paroxysmal A-fib.  Continue follow-up with ENT for his right sinonasal mass.  Followup in the future with me in 6 to 8 months or call earlier if necessary   I personally spent a total of 60 minutes in the care of the patient today including getting/reviewing separately obtained history, performing a medically appropriate exam/evaluation, counseling and educating, placing orders, referring and communicating with other health care professionals, documenting clinical information in the EHR, independently interpreting results, and coordinating care.     Eather Popp, MD   Note: This document was prepared with digital dictation and possible smart phrase technology. Any transcriptional  errors that result from this process are unintentional.

## 2024-07-11 NOTE — Patient Instructions (Signed)
 I had a long d/w patient about his recent stroke, silent strokes, vertebral artery stenosis, risk for recurrent stroke/TIAs, personally independently reviewed imaging studies and stroke evaluation results and answered questions.Continue aspirin 81 mg daily and clopidogrel 75 mg daily  for 3 weeks and then aspirin alone for secondary stroke prevention and maintain strict control of hypertension with blood pressure goal below 130/90, diabetes with hemoglobin A1c goal below 6.5% and lipids with LDL cholesterol goal below 70 mg/dL. I also advised the patient to eat a healthy diet with plenty of whole grains, cereals, fruits and vegetables, exercise regularly and maintain ideal body weight .I the patient to increase participation in cognitively challenging activities like solving crossword puzzles, playing bridge and sudoku.  We also discussed memory compensation strategies.  Check memory panel labs, EEG and 30-day heart monitor for paroxysmal A-fib.  Followup in the future with me in 6 to 8 months or call earlier if necessary Memory Compensation Strategies  Use WARM strategy.  W= write it down  A= associate it  R= repeat it  M= make a mental note  2.   You can keep a Glass Blower/designer.  Use a 3-ring notebook with sections for the following: calendar, important names and phone numbers,  medications, doctors' names/phone numbers, lists/reminders, and a section to journal what you did  each day.   3.    Use a calendar to write appointments down.  4.    Write yourself a schedule for the day.  This can be placed on the calendar or in a separate section of the Memory Notebook.  Keeping a  regular schedule can help memory.  5.    Use medication organizer with sections for each day or morning/evening pills.  You may need help loading it  6.    Keep a basket, or pegboard by the door.  Place items that you need to take out with you in the basket or on the pegboard.  You may also want to  include a message  board for reminders.  7.    Use sticky notes.  Place sticky notes with reminders in a place where the task is performed.  For example:  turn off the  stove placed by the stove, lock the door placed on the door at eye level,  take your medications on  the bathroom mirror or by the place where you normally take your medications.  8.    Use alarms/timers.  Use while cooking to remind yourself to check on food or as a reminder to take your medicine, or as a  reminder to make a call, or as a reminder to perform another task, etc.  Stroke Prevention Some medical conditions and behaviors can lead to a higher chance of having a stroke. You can help prevent a stroke by eating healthy, exercising, not smoking, and managing any medical conditions you have. Stroke is a leading cause of functional impairment. Primary prevention is particularly important because a majority of strokes are first-time events. Stroke changes the lives of not only those who experience a stroke but also their family and other caregivers. How can this condition affect me? A stroke is a medical emergency and should be treated right away. A stroke can lead to brain damage and can sometimes be life-threatening. If a person gets medical treatment right away, there is a better chance of surviving and recovering from a stroke. What can increase my risk? The following medical conditions may increase your risk of a stroke:  Cardiovascular disease. High blood pressure (hypertension). Diabetes. High cholesterol. Sickle cell disease. Blood clotting disorders (hypercoagulable state). Obesity. Sleep disorders (obstructive sleep apnea). Other risk factors include: Being older than age 72. Having a history of blood clots, stroke, or mini-stroke (transient ischemic attack, TIA). Genetic factors, such as race, ethnicity, or a family history of stroke. Smoking cigarettes or using other tobacco products. Taking birth control pills,  especially if you also use tobacco. Heavy use of alcohol or drugs, especially cocaine and methamphetamine. Physical inactivity. What actions can I take to prevent this? Manage your health conditions High cholesterol levels. Eating a healthy diet is important for preventing high cholesterol. If cholesterol cannot be managed through diet alone, you may need to take medicines. Take any prescribed medicines to control your cholesterol as told by your health care provider. Hypertension. To reduce your risk of stroke, try to keep your blood pressure below 130/80. Eating a healthy diet and exercising regularly are important for controlling blood pressure. If these steps are not enough to manage your blood pressure, you may need to take medicines. Take any prescribed medicines to control hypertension as told by your health care provider. Ask your health care provider if you should monitor your blood pressure at home. Have your blood pressure checked every year, even if your blood pressure is normal. Blood pressure increases with age and some medical conditions. Diabetes. Eating a healthy diet and exercising regularly are important parts of managing your blood sugar (glucose). If your blood sugar cannot be managed through diet and exercise, you may need to take medicines. Take any prescribed medicines to control your diabetes as told by your health care provider. Get evaluated for obstructive sleep apnea. Talk to your health care provider about getting a sleep evaluation if you snore a lot or have excessive sleepiness. Make sure that any other medical conditions you have, such as atrial fibrillation or atherosclerosis, are managed. Nutrition Follow instructions from your health care provider about what to eat or drink to help manage your health condition. These instructions may include: Reducing your daily calorie intake. Limiting how much salt (sodium) you use to 1,500 milligrams (mg) each day. Using  only healthy fats for cooking, such as olive oil, canola oil, or sunflower oil. Eating healthy foods. You can do this by: Choosing foods that are high in fiber, such as whole grains, and fresh fruits and vegetables. Eating at least 5 servings of fruits and vegetables a day. Try to fill one-half of your plate with fruits and vegetables at each meal. Choosing lean protein foods, such as lean cuts of meat, poultry without skin, fish, tofu, beans, and nuts. Eating low-fat dairy products. Avoiding foods that are high in sodium. This can help lower blood pressure. Avoiding foods that have saturated fat, trans fat, and cholesterol. This can help prevent high cholesterol. Avoiding processed and prepared foods. Counting your daily carbohydrate intake.  Lifestyle If you drink alcohol: Limit how much you have to: 0-1 drink a day for women who are not pregnant. 0-2 drinks a day for men. Know how much alcohol is in your drink. In the [place] one drink equals one 12 oz bottle of beer ( ), one 5 oz glass of wine ( ), or one 1 oz glass of hard liquor (44mL). Do not use any products that contain nicotine or tobacco. These products include cigarettes, chewing tobacco, and vaping devices, such as e-cigarettes. If you need help quitting, ask your health care provider. Avoid secondhand smoke. Do not use  drugs. Activity  Try to stay at a healthy weight. Get at least 30 minutes of exercise on most days, such as: Fast walking. Biking. Swimming. Medicines Take over-the-counter and prescription medicines only as told by your health care provider. Aspirin or blood thinners (antiplatelets or anticoagulants) may be recommended to reduce your risk of forming blood clots that can lead to stroke. Avoid taking birth control pills. Talk to your health care provider about the risks of taking birth control pills if: You are over 54 years old. You smoke. You get very bad headaches. You have had a blood  clot. Where to find more information American Stroke Association: www.strokeassociation.org Get help right away if: You or a loved one has any symptoms of a stroke. BE FAST is an easy way to remember the main warning signs of a stroke: B - Balance. Signs are dizziness, sudden trouble walking, or loss of balance. E - Eyes. Signs are trouble seeing or a sudden change in vision. F - Face. Signs are sudden weakness or numbness of the face, or the face or eyelid drooping on one side. A - Arms. Signs are weakness or numbness in an arm. This happens suddenly and usually on one side of the body. S - Speech. Signs are sudden trouble speaking, slurred speech, or trouble understanding what people say. T - Time. Time to call emergency services. Write down what time symptoms started. You or a loved one has other signs of a stroke, such as: A sudden, severe headache with no known cause. Nausea or vomiting. Seizure. These symptoms may represent a serious problem that is an emergency. Do not wait to see if the symptoms will go away. Get medical help right away. Call your local emergency services (911 in the [place] Do not drive yourself to the hospital. Summary You can help to prevent a stroke by eating healthy, exercising, not smoking, limiting alcohol intake, and managing any medical conditions you may have. Do not use any products that contain nicotine or tobacco. These include cigarettes, chewing tobacco, and vaping devices, such as e-cigarettes. If you need help quitting, ask your health care provider. Remember BE FAST for warning signs of a stroke. Get help right away if you or a loved one has any of these signs. This information is not intended to replace advice given to you by your health care provider. Make sure you discuss any questions you have with your health care provider. Document Revised: 08/01/2022 Document Reviewed: 08/01/2022 Elsevier Patient Education  2024 [employer].

## 2024-07-13 LAB — DEMENTIA PANEL
Homocysteine: 16.9 umol/L (ref 0.0–17.2)
RPR Ser Ql: NONREACTIVE
TSH: 1.17 u[IU]/mL (ref 0.450–4.500)
Vitamin B-12: 591 pg/mL (ref 232–1245)

## 2024-07-14 ENCOUNTER — Ambulatory Visit: Payer: Self-pay | Admitting: Neurology

## 2024-07-15 ENCOUNTER — Other Ambulatory Visit (INDEPENDENT_AMBULATORY_CARE_PROVIDER_SITE_OTHER): Payer: Self-pay

## 2024-07-15 DIAGNOSIS — J3489 Other specified disorders of nose and nasal sinuses: Secondary | ICD-10-CM

## 2024-07-18 ENCOUNTER — Ambulatory Visit (INDEPENDENT_AMBULATORY_CARE_PROVIDER_SITE_OTHER): Payer: Self-pay | Admitting: Neurology

## 2024-07-18 DIAGNOSIS — R4182 Altered mental status, unspecified: Secondary | ICD-10-CM

## 2024-07-18 DIAGNOSIS — R413 Other amnesia: Secondary | ICD-10-CM

## 2024-07-29 NOTE — Telephone Encounter (Signed)
 Pt called  stating that he hasn't heard back about result  , Informed That results were sent thru Mychart . Pt stated he would like call

## 2024-07-29 NOTE — Telephone Encounter (Signed)
-----   Message from Perla Simpers sent at 07/29/2024  1:37 PM EST -----

## 2024-07-29 NOTE — Telephone Encounter (Signed)
 I called pt and relayed relayed of lab results.  He asked also about EEG results  I did give him the results as normal from 07-18-2024: Summary  Normal electroencephalogram, awake, asleep and with activation procedures. There are no focal lateralizing or epileptiform features.   Dr. Rosemarie had not noted his result note but I would like him know that I gave him to you.    He also had question about heart monitor.  He received in mail and did not know how to put on.  I told him he will need to call the # to address with cardiology office.  He did find the # and will call.  He appreciated call back.

## 2024-07-30 ENCOUNTER — Encounter: Payer: Self-pay | Admitting: Family Medicine

## 2024-07-30 ENCOUNTER — Ambulatory Visit (INDEPENDENT_AMBULATORY_CARE_PROVIDER_SITE_OTHER): Payer: Self-pay | Admitting: Family Medicine

## 2024-07-30 VITALS — BP 162/94 | HR 82 | Ht 69.75 in | Wt 190.2 lb

## 2024-07-30 DIAGNOSIS — J3489 Other specified disorders of nose and nasal sinuses: Secondary | ICD-10-CM

## 2024-07-30 DIAGNOSIS — C61 Malignant neoplasm of prostate: Secondary | ICD-10-CM

## 2024-07-30 DIAGNOSIS — Z7689 Persons encountering health services in other specified circumstances: Secondary | ICD-10-CM

## 2024-07-30 DIAGNOSIS — I10 Essential (primary) hypertension: Secondary | ICD-10-CM

## 2024-07-30 DIAGNOSIS — Z8673 Personal history of transient ischemic attack (TIA), and cerebral infarction without residual deficits: Secondary | ICD-10-CM

## 2024-07-30 DIAGNOSIS — R269 Unspecified abnormalities of gait and mobility: Secondary | ICD-10-CM

## 2024-07-30 DIAGNOSIS — E7800 Pure hypercholesterolemia, unspecified: Secondary | ICD-10-CM

## 2024-07-30 MED ORDER — LOSARTAN POTASSIUM 50 MG PO TABS: 50.0000 mg | ORAL_TABLET | Freq: Every day | ORAL | 0 refills | Status: AC

## 2024-07-30 NOTE — Progress Notes (Unsigned)
 New Patient Office Visit  Subjective    Patient ID: Roberto Watson, male    DOB: 1961/04/04  Age: 63 y.o. MRN: [id number]  CC:  Chief Complaint  Patient presents with   Establish Care    Pt reports he has been told he had multiple strokes in back of head. Reports walking, memory, and speech slurred at times.     HPI Roberto Watson presents with his wife to establish care. Patient was recently admitted to the hospital with dx of cerebellar stroke. Patient has had difficulty ambulating as well as residual issues such as memory and speech. 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 At this time he does not have insurance but will turn 64 in March and will have Medicare. They are wanting to defer more expensive procedures or evaluation to that time if possible   Outpatient Encounter Medications as of 07/30/2024  Medication Sig   aspirin EC 81 MG tablet Take 1 tablet (81 mg total) by mouth daily. Swallow whole.   atorvastatin (LIPITOR) 80 MG tablet Take 1 tablet (80 mg total) by mouth daily.   clopidogrel (PLAVIX) 75 MG tablet Take 1 tablet (75 mg total) by mouth daily.   losartan (COZAAR) 50 MG tablet Take 1 tablet (50 mg total) by mouth daily.   No facility-administered encounter medications on file as of 07/30/2024.    Past Medical History:  Diagnosis Date   Prostate cancer (HCC)    Seizures (HCC)     Past Surgical History:  Procedure Laterality Date   HERNIA REPAIR      History reviewed. No pertinent family history.  Social History   Socioeconomic History   Marital status: Married    Spouse name: Not on file   Number of children: Not on file   Years of education: Not on file   Highest education level: Not on file  Occupational History   Not on file  Tobacco Use   Smoking status: Never   Smokeless tobacco: Never  Vaping Use   Vaping status: Never Used  Substance and Sexual Activity   Alcohol use: No    Comment: social    Drug use: No   Sexual  activity: Yes  Other Topics Concern   Not on file  Social History Narrative   ** Merged History Encounter **       Social Drivers of Health   Financial Resource Strain: Low Risk  (07/30/2024)   Overall Financial Resource Strain (CARDIA)    Difficulty of Paying Living Expenses: Not hard at all  Food Insecurity: Low Risk  (07/25/2024)   Received from [facility]   Hunger Vital Sign    Within the past 12 months, you worried that your food would run out before you got money to buy more: Never true    Within the past 12 months, the food you bought just didn't last and you didn't have money to get more. : Never true  Transportation Needs: No Transportation Needs (07/25/2024)   Received from [facility]    In the past 12 months, has lack of reliable transportation kept you from medical appointments, meetings, work or from getting things needed for daily living? : No  Physical Activity: Inactive (07/30/2024)   Exercise Vital Sign    Days of Exercise per Week: 0 days    Minutes of Exercise per Session: 0 min  Stress: No Stress Concern Present (07/30/2024)   [employer]  of Occupational Health - Occupational Stress Questionnaire    Feeling of Stress: Not at all  Social Connections: Moderately Integrated (06/20/2024)   Social Connection and Isolation Panel    Frequency of Communication with Friends and Family: Once a week    Frequency of Social Gatherings with Friends and Family: Once a week    Attends Religious Services: More than 4 times per year    Active Member of [employer] or Organizations: Yes    Attends Banker Meetings: More than 4 times per year    Marital Status: Married  Catering Manager Violence: Not At Risk (06/20/2024)   Humiliation, Afraid, Rape, and Kick questionnaire    Fear of Current or Ex-Partner: No    Emotionally Abused: No    Physically Abused: No    Sexually Abused: No    Review of Systems  Musculoskeletal:  Negative for  falls.  Neurological:  Positive for speech change. Negative for headaches.  Psychiatric/Behavioral:  Positive for memory loss.   All other systems reviewed and are negative.       Objective   BP (!) 162/94   Pulse 82   Ht 5' 9.75 (1.772 m)   Wt 190 lb 3.2 oz (86.3 kg)   SpO2 96%   BMI 27.49 kg/m   Physical Exam Vitals and nursing note reviewed.  Constitutional:      General: He is not in acute distress. Cardiovascular:     Rate and Rhythm: Normal rate and regular rhythm.  Pulmonary:     Effort: Pulmonary effort is normal.     Breath sounds: Normal breath sounds.  Abdominal:     Palpations: Abdomen is soft.     Tenderness: There is no abdominal tenderness.  Neurological:     Mental Status: He is alert and oriented to person, place, and time.     Gait: Gait abnormal.  Psychiatric:        Mood and Affect: Mood normal.        Speech: Speech is not slurred.        Behavior: Behavior normal. Behavior is cooperative.        Thought Content: Thought content normal.         Assessment & Plan:   1. Essential hypertension (Primary) Elevated readings. Meds had been d/c in hospital. Will start losartan 50 mg as recommended (no CCB or BB)  2. Pure hypercholesterolemia Continue   3. History of cerebellar stroke Keep scheduled follow up with neuro for further eval/mgt  4. 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 Malignant neoplasm of prostate (HCC) Continue as per consultant   7. 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 Patient recommended to continue no driving or use of firearm and consider FMLA/retirement.  Return in about 3 months (around 10/30/2024) for follow up.   Roberto Raguel SQUIBB, MD

## 2024-07-31 ENCOUNTER — Encounter: Payer: Self-pay | Admitting: Family Medicine

## 2024-08-05 ENCOUNTER — Telehealth: Payer: Self-pay | Admitting: Family Medicine

## 2024-08-05 NOTE — Telephone Encounter (Signed)
 Pt stated he received losartan  50mg  but needs 10mg . Please advise

## 2024-08-05 NOTE — Telephone Encounter (Signed)
 Noted

## 2024-08-05 NOTE — Telephone Encounter (Signed)
 Patient dropped off document Handicap placard form, to be filled out by provider. Patient requested to send it back via Call Patient to pick up within 7-days to 14 days. Document is located in providers tray at front office.Please advise at Mobile [phone] (mobile)

## 2024-08-06 NOTE — Telephone Encounter (Signed)
 Called and advised pt. Pt expressed understanding

## 2024-08-20 ENCOUNTER — Ambulatory Visit: Payer: Self-pay | Admitting: Neurology

## 2024-10-30 ENCOUNTER — Ambulatory Visit: Admitting: Family Medicine
